# Patient Record
Sex: Male | Born: 1980 | Race: White | Hispanic: No | Marital: Single | Smoking: Former smoker
Health system: Southern US, Community
[De-identification: ages and names within clinical notes are randomized; demographics above are authoritative.]

## PROBLEM LIST (undated history)

## (undated) DIAGNOSIS — F22 Delusional disorders: Secondary | ICD-10-CM

## (undated) DIAGNOSIS — F32A Depression, unspecified: Secondary | ICD-10-CM

## (undated) DIAGNOSIS — T1491XA Suicide attempt, initial encounter: Secondary | ICD-10-CM

## (undated) DIAGNOSIS — F319 Bipolar disorder, unspecified: Secondary | ICD-10-CM

## (undated) DIAGNOSIS — F329 Major depressive disorder, single episode, unspecified: Secondary | ICD-10-CM

## (undated) HISTORY — PX: ABDOMINAL SURGERY: SHX537

---

## 2004-06-19 ENCOUNTER — Emergency Department: Payer: Self-pay | Admitting: Emergency Medicine

## 2007-01-30 ENCOUNTER — Emergency Department: Payer: Self-pay | Admitting: Emergency Medicine

## 2008-12-10 ENCOUNTER — Emergency Department: Payer: Self-pay | Admitting: Emergency Medicine

## 2010-05-13 ENCOUNTER — Inpatient Hospital Stay: Payer: Self-pay | Admitting: Psychiatry

## 2011-08-01 ENCOUNTER — Emergency Department: Payer: Self-pay | Admitting: Emergency Medicine

## 2012-02-19 ENCOUNTER — Emergency Department (HOSPITAL_COMMUNITY)
Admission: EM | Admit: 2012-02-19 | Discharge: 2012-02-19 | Disposition: A | Discharge status: Home / Self Care | Payer: Self-pay | Attending: Emergency Medicine | Admitting: Emergency Medicine

## 2012-02-19 ENCOUNTER — Encounter (HOSPITAL_COMMUNITY): Payer: Self-pay

## 2012-02-19 DIAGNOSIS — F17200 Nicotine dependence, unspecified, uncomplicated: Secondary | ICD-10-CM | POA: Insufficient documentation

## 2012-02-19 DIAGNOSIS — F319 Bipolar disorder, unspecified: Secondary | ICD-10-CM | POA: Insufficient documentation

## 2012-02-19 DIAGNOSIS — L03119 Cellulitis of unspecified part of limb: Secondary | ICD-10-CM | POA: Insufficient documentation

## 2012-02-19 DIAGNOSIS — L02419 Cutaneous abscess of limb, unspecified: Secondary | ICD-10-CM | POA: Insufficient documentation

## 2012-02-19 DIAGNOSIS — L0291 Cutaneous abscess, unspecified: Secondary | ICD-10-CM

## 2012-02-19 HISTORY — DX: Bipolar disorder, unspecified: F31.9

## 2012-02-19 HISTORY — DX: Suicide attempt, initial encounter: T14.91XA

## 2012-02-19 HISTORY — DX: Depression, unspecified: F32.A

## 2012-02-19 HISTORY — DX: Delusional disorders: F22

## 2012-02-19 HISTORY — DX: Major depressive disorder, single episode, unspecified: F32.9

## 2012-02-19 MED ORDER — DOXYCYCLINE HYCLATE 100 MG PO CAPS: 100.0000 mg | ORAL_CAPSULE | Freq: Two times a day (BID) | ORAL | Status: AC

## 2012-02-19 MED ORDER — DOXYCYCLINE HYCLATE 100 MG PO TABS
100.0000 mg | ORAL_TABLET | Freq: Once | ORAL | Status: AC
  Administered 2012-02-19: 100 mg via ORAL
  Filled 2012-02-19: qty 1

## 2012-02-19 NOTE — ED Provider Notes (Signed)
Medical screening examination/treatment/procedure(s) were performed by non-physician practitioner and as supervising physician I was immediately available for consultation/collaboration.   Lehman Whiteley W. Manami Tutor, MD 02/19/12 2108 

## 2012-02-19 NOTE — ED Notes (Signed)
Pt reports 2 spider bites 1 to left thigh and 1 to right thigh, did not see spider but areas have 2 puncture marks, areas are not draining, painful --no fever.

## 2012-02-19 NOTE — Discharge Instructions (Signed)
Abscess An abscess (boil or furuncle) is an infected area that contains a collection of pus.  SYMPTOMS Signs and symptoms of an abscess include pain, tenderness, redness, or hardness. You may feel a moveable soft area under your skin. An abscess can occur anywhere in the body.  TREATMENT  A surgical cut (incision) may be made over your abscess to drain the pus. Gauze may be packed into the space or a drain may be looped through the abscess cavity (pocket). This provides a drain that will allow the cavity to heal from the inside outwards. The abscess may be painful for a few days, but should feel much better if it was drained.  Your abscess, if seen early, may not have localized and may not have been drained. If not, another appointment may be required if it does not get better on its own or with medications. HOME CARE INSTRUCTIONS   Only take over-the-counter or prescription medicines for pain, discomfort, or fever as directed by your caregiver.   Take your antibiotics as directed if they were prescribed. Finish them even if you start to feel better.   Keep the skin and clothes clean around your abscess.   If the abscess was drained, you will need to use gauze dressing to collect any draining pus. Dressings will typically need to be changed 3 or more times a day.   The infection may spread by skin contact with others. Avoid skin contact as much as possible.   Practice good hygiene. This includes regular hand washing, cover any draining skin lesions, and do not share personal care items.   If you participate in sports, do not share athletic equipment, towels, whirlpools, or personal care items. Shower after every practice or tournament.   If a draining area cannot be adequately covered:   Do not participate in sports.   Children should not participate in day care until the wound has healed or drainage stops.   If your caregiver has given you a follow-up appointment, it is very important  to keep that appointment. Not keeping the appointment could result in a much worse infection, chronic or permanent injury, pain, and disability. If there is any problem keeping the appointment, you must call back to this facility for assistance.  SEEK MEDICAL CARE IF:   You develop increased pain, swelling, redness, drainage, or bleeding in the wound site.   You develop signs of generalized infection including muscle aches, chills, fever, or a general ill feeling.   You have an oral temperature above 102 F (38.9 C).  MAKE SURE YOU:   Understand these instructions.   Will watch your condition.   Will get help right away if you are not doing well or get worse.  Document Released: 05/27/2005 Document Revised: 08/06/2011 Document Reviewed: 03/20/2008 [facility] Patient Information 2012 [place], [place].Heat Therapy Your caregiver advises heat therapy for your condition. Heat applications help reduce pain and muscle spasm around injuries or areas of inflammation. They also increase blood flow to the area which can speed healing. Moist heat is commonly used to help heal skin infections. Heat treatments should be used for about 30-40 minutes every 2-4 hours. Shorter treatments should be used if there is discomfort. Different forms of heat therapy are:  Warm water - Use a basin or tub filled with heated water; change it often to keep the water hot. The water temperature should not be uncomfortable to the skin.   Hot packs - Use several bath towels soaked in hot  water and lightly wrung out. These should be changed every 5-10 minutes. You can buy commercially-available packs that provide more sustained heat. Hot water bottles are not recommended because they give only a small amount of heat.   Electric heating pads - These may be used for dry heat only. Do not use wet material around a regular heating pad because of the risk of electrical shock. Do not leave heating pads on for long periods as they can  burn the skin or cause permanent discoloration. Do not lie on top of a heating pad because, again, this can cause a burn.   Heat lamps - Use an infrared light. Keep the bulb 15-25 inches from the skin. Watch for signs of excessive heat (blotchy areas will appear).  Be cautious with heat therapy to avoid burning the skin. You should not use heat therapy without careful medical supervision if you have: circulation problems, numbness or unusual swelling in the area to be treated. Document Released: 08/17/2005 Document Revised: 08/06/2011 Document Reviewed: 02/12/2007 [facility] Patient Information 2012 [place], [place].  Take the antibiotic as directed.  Apply warm compresses several times daily.  Return if symptoms worsen.

## 2012-02-19 NOTE — ED Provider Notes (Signed)
History     CSN: [id number]  Arrival date & time 02/19/12  1029   First MD Initiated Contact with Patient 02/19/12 1043      Chief Complaint  Patient presents with  . Wound Check    (Consider location/radiation/quality/duration/timing/severity/associated sxs/prior treatment) HPI Comments: Not seen in ED before today for this problem.  Thinks a spider bit him.  Started 3 days ago.  No h/o MRSA  Patient is a 31 y.o. male presenting with wound check. The history is provided by the patient. No language interpreter was used.  Wound Check     Past Medical History  Diagnosis Date  . Depression   . Bipolar disorder   . Suicide attempt   . Paranoia     History reviewed. No pertinent past surgical history.  No family history on file.  History  Substance Use Topics  . Smoking status: Current Everyday Smoker    Types: Cigarettes  . Smokeless tobacco: Not on file  . Alcohol Use: No      Review of Systems  Constitutional: Negative for fever and chills.  Skin: Positive for wound.  All other systems reviewed and are negative.    Allergies  Review of patient's allergies indicates no known allergies.  Home Medications   Current Outpatient Rx  Name Route Sig Dispense Refill  . TEGRETOL PO Oral Take 1 tablet by mouth daily.    Marland Kitchen DOXYCYCLINE HYCLATE 100 MG PO CAPS Oral Take 1 capsule (100 mg total) by mouth 2 (two) times daily. 20 capsule 0    BP 133/79  Pulse 88  Temp 97.3 F (36.3 C) (Oral)  Resp 18  Ht 5\' 9"  (1.753 m)  Wt 155 lb (70.308 kg)  BMI 22.89 kg/m2  SpO2 100%  Physical Exam  Nursing note and vitals reviewed. Constitutional: He is oriented to person, place, and time. He appears well-developed and well-nourished.  HENT:  Head: Normocephalic and atraumatic.  Eyes: EOM are normal.  Neck: Normal range of motion.  Cardiovascular: Normal rate, regular rhythm, normal heart sounds and intact distal pulses.   Pulmonary/Chest: Effort normal and breath  sounds normal. No respiratory distress.  Abdominal: Soft. He exhibits no distension. There is no tenderness.  Musculoskeletal: Normal range of motion. He exhibits tenderness.       Legs: Neurological: He is alert and oriented to person, place, and time.  Skin: Skin is warm and dry.  Psychiatric: He has a normal mood and affect. Judgment normal.    ED Course  Procedures (including critical care time)  Labs Reviewed - No data to display No results found.   1. Abscess       MDM  rx doxycycline, 20 Heat Return prn.        Worthy Rancher, PA 02/19/12 [phone]

## 2015-11-22 ENCOUNTER — Emergency Department
Admission: EM | Admit: 2015-11-22 | Discharge: 2015-11-22 | Disposition: A | Discharge status: Home / Self Care | Payer: Self-pay | Attending: Emergency Medicine | Admitting: Emergency Medicine

## 2015-11-22 ENCOUNTER — Encounter: Payer: Self-pay | Admitting: *Deleted

## 2015-11-22 DIAGNOSIS — Z79899 Other long term (current) drug therapy: Secondary | ICD-10-CM | POA: Insufficient documentation

## 2015-11-22 DIAGNOSIS — Y9389 Activity, other specified: Secondary | ICD-10-CM | POA: Insufficient documentation

## 2015-11-22 DIAGNOSIS — F329 Major depressive disorder, single episode, unspecified: Secondary | ICD-10-CM | POA: Insufficient documentation

## 2015-11-22 DIAGNOSIS — Z87891 Personal history of nicotine dependence: Secondary | ICD-10-CM | POA: Insufficient documentation

## 2015-11-22 DIAGNOSIS — S61215A Laceration without foreign body of left ring finger without damage to nail, initial encounter: Secondary | ICD-10-CM | POA: Insufficient documentation

## 2015-11-22 DIAGNOSIS — Y999 Unspecified external cause status: Secondary | ICD-10-CM | POA: Insufficient documentation

## 2015-11-22 DIAGNOSIS — S61209A Unspecified open wound of unspecified finger without damage to nail, initial encounter: Secondary | ICD-10-CM

## 2015-11-22 DIAGNOSIS — F22 Delusional disorders: Secondary | ICD-10-CM | POA: Insufficient documentation

## 2015-11-22 DIAGNOSIS — W260XXA Contact with knife, initial encounter: Secondary | ICD-10-CM | POA: Insufficient documentation

## 2015-11-22 DIAGNOSIS — Y929 Unspecified place or not applicable: Secondary | ICD-10-CM | POA: Insufficient documentation

## 2015-11-22 NOTE — ED Notes (Signed)
Pt cut third finger of left hand while shopping up a salad this evening. "I was tryin to be healthy" pt states. Half of nail is missing from same finger.

## 2015-11-22 NOTE — ED Provider Notes (Signed)
[facility] Emergency Department Provider Note  ____________________________________________  Time seen: Approximately 7:56 PM  I have reviewed the triage vital signs and the nursing notes.   HISTORY  Chief Complaint Extremity Laceration    HPI Carl Holt is a 35 y.o. male who presents emergency department complaining of a laceration to the left third digit. Patient states that he was cutting a salad with a sharp knife when he accidentally sliced the distal end of his finger. Patient was able control the bleeding prior to arrival with direct pressure. Patient states that he is up-to-date on his immunizations. He denies any other complaints at this time.   Past Medical History  Diagnosis Date  . Depression   . Bipolar disorder (HCC)   . Suicide attempt (HCC)   . Paranoia (HCC)     There are no active problems to display for this patient.   Past Surgical History  Procedure Laterality Date  . Abdominal surgery      Current Outpatient Rx  Name  Route  Sig  Dispense  Refill  . CarBAMazepine (TEGRETOL PO)   Oral   Take 1 tablet by mouth daily.           Allergies Review of patient's allergies indicates no known allergies.  History reviewed. No pertinent family history.  Social History Social History  Substance Use Topics  . Smoking status: Former Smoker    Types: Cigarettes  . Smokeless tobacco: None  . Alcohol Use: No     Review of Systems  Musculoskeletal: Negative for hand pain. Skin: Negative for rash. Positive for laceration to the distal third digit right hand. Neurological: Negative for headaches, focal weakness or numbness. 10-point ROS otherwise negative.  ____________________________________________   PHYSICAL EXAM:  VITAL SIGNS: ED Triage Vitals  Enc Vitals Group     BP 11/22/15 1937 158/97 mmHg     Pulse Rate 11/22/15 1937 80     Resp 11/22/15 1937 16     Temp 11/22/15 1937 98 F (36.7 C)     Temp  Source 11/22/15 1937 Oral     SpO2 11/22/15 1937 97 %     Weight 11/22/15 1937 160 lb (72.576 kg)     Height 11/22/15 1937  (1.753 m)     Head Cir --      Peak Flow --      Pain Score 11/22/15 1936 4     Pain Loc --      Pain Edu? --      Excl. in GC? --      Constitutional: Alert and oriented. Well appearing and in no acute distress.. Cardiovascular: Normal rate, regular rhythm. Normal S1 and S2.  Good peripheral circulation. Respiratory: Normal respiratory effort without tachypnea or retractions. Lungs CTAB. Musculoskeletal: Full range of motion to all digits of the left hand. Sensation intact 5 digits. Cap refill intact 5 digits. Neurologic:  Normal speech and language. No gross focal neurologic deficits are appreciated.  Skin:  Skin is warm, dry and intact. No rash noted. Avulsion noted to the distal aspect of the third digit left hand. Area is bleeding after pressure is removed. No foreign body. Psychiatric: Mood and affect are normal. Speech and behavior are normal. Patient exhibits appropriate insight and judgement.   ____________________________________________   LABS (all labs ordered are listed, but only abnormal results are displayed)  Labs Reviewed - No data to display ____________________________________________  EKG   ____________________________________________  RADIOLOGY   No results  found.  ____________________________________________    PROCEDURES  Procedure(s) performed:    LACERATION REPAIR Performed by: Racheal PatchesJonathan D Cuthriell Authorized by: Delorise RoyalsJonathan D Cuthriell Consent: Verbal consent obtained. Risks and benefits: risks, benefits and alternatives were discussed Consent given by: patient Patient identity confirmed: provided demographic data Prepped and Draped in normal sterile fashion Wound explored  Laceration Location: Third digit left hand  No Foreign Bodies seen or palpated  Irrigation method: syringe Amount of cleaning:  standard  Skin closure: The area is an avulsion with no closable edges.   Technique: Area is packed using Surgicel with good cessation of bleeding. This is covered using a nonadhesive dressing and is wrapped firmly.   Patient tolerance: Patient tolerated the procedure well with no immediate complications.    Medications - No data to display   ____________________________________________   INITIAL IMPRESSION / ASSESSMENT AND PLAN / ED COURSE  Pertinent labs & imaging results that were available during my care of the patient were reviewed by me and considered in my medical decision making (see chart for details).  Patient's diagnosis is consistent with avulsion to the distal aspect of the third digit left hand. This is treated as described above. No complications. .Patient is to follow up with primary care provider if symptoms persist past this treatment course. Patient is given ED precautions to return to the ED for any worsening or new symptoms.     ____________________________________________  FINAL CLINICAL IMPRESSION(S) / ED DIAGNOSES  Final diagnoses:  Finger avulsion, initial encounter      NEW MEDICATIONS STARTED DURING THIS VISIT:  New Prescriptions   No medications on file        This chart was dictated using voice recognition software/Dragon. Despite best efforts to proofread, errors can occur which can change the meaning. Any change was purely unintentional.    Racheal PatchesJonathan D Cuthriell, PA-C 11/22/15 2001  Arnaldo NatalPaul F Malinda, MD 11/22/15 2116

## 2015-11-22 NOTE — ED Notes (Signed)
Pt presents w/ L 3rd digit laceration sustained while cutting lettuce w/ knife. Bleeding controlled w/ dressing applied in triage.

## 2015-11-22 NOTE — Discharge Instructions (Signed)
Deep Skin Avulsion A deep skin avulsion is a type of open wound. It often results from a severe injury (trauma) that tears away all layers of the skin or an entire body part. The areas of the body that are most often affected by a deep skin avulsion include the face, lips, ears, nose, and fingers. A deep skin avulsion may make structures below the skin become visible. You may be able to see muscle, bone, nerves, and blood vessels. A deep skin avulsion can also damage important structures beneath the skin. These include tendons, ligaments, nerves, or blood vessels. CAUSES Injuries that often cause a deep skin avulsion include:  Being crushed.  Falling against a jagged surface.  Animal bites.  Gunshot wounds.  Severe burns.  Injuries that involve being dragged, such as bicycle or motorcycle accidents. SYMPTOMS Symptoms of a deep skin avulsion include:  Pain.  Numbness.  Swelling.  A misshapen body part.  Bleeding, which may be heavy.  Fluid leaking from the wound. DIAGNOSIS This condition may be diagnosed with a medical history and physical exam. You may also have X-rays done. TREATMENT The treatment that is chosen for a deep skin avulsion depends on how large and deep the wound is and where it is located. Treatment for all types of avulsions usually starts with:  Controlling the bleeding.  Washing out the wound with a germ-free (sterile) salt-water solution.  Removing dead tissue from the wound. A wound may be closed or left open to heal. This depends on the size and location of the wound and whether it is likely to become infected. Wounds are usually covered or closed if they expose blood vessels, nerves, bone, or cartilage.  Wounds that are small and clean may be closed with stitches (sutures).  Wounds that cannot be closed with sutures may be covered with a piece of skin (graft) or a skin flap. Skin may be taken from on or near the wound, from another part of the body,  or from a donor.  Wounds may be left open if they are hard to close or they may become infected. These wounds heal over time from the bottom up. You may also receive medicine. This may include:  Antibiotics.  A tetanus shot.  Rabies vaccine. HOME CARE INSTRUCTIONS Medicines  Take or apply over-the-counter and prescription medicines only as told by your health care provider.  If you were prescribed an antibiotic, take or apply it as told by your health care provider. Do not stop taking the antibiotic even if your condition improves.  You may get anti-itch medicine while your wound is healing. Use it only as told by your health care provider. Wound Care  There are many ways to close and cover a wound. For example, a wound can be covered with sutures, skin glue, or adhesive strips. Follow instructions from your health care provider about:  How to take care of your wound.  When and how you should change your bandage (dressing).  When you should remove your dressing.  Removing whatever was used to close your wound.  Keep the dressing dry as told by your health care provider. Do not take baths, swim, use a hot tub, or do anything that would put your wound underwater until your health care provider approves.  Clean the wound each day or as told by your health care provider.  Wash the wound with mild soap and water.  Rinse the wound with water to remove all soap.  Pat the wound   dry with a clean towel. Do not rub it.  Do not scratch or pick at the wound.  Check your wound every day for signs of infection. Watch for:  Redness, swelling, or pain.  Fluid, blood, or pus. General Instructions  Raise (elevate) the injured area above the level of your heart while you are sitting or lying down.  Keep all follow-up visits as told by your health care provider. This is important. SEEK MEDICAL CARE IF:  You received a tetanus shot and you have swelling, severe pain, redness, or  bleeding at the injection site.  You have a fever.  Your pain is not controlled with medicine.  You have increased redness, swelling, or pain at the site of your wound.  You have fluid, blood, or pus coming from your wound.  You notice a bad smell coming from your wound or your dressing.  A wound that was closed breaks open.  You notice something coming out of the wound, such as wood or glass.  You notice a change in the color of your skin near your wound.  You develop a new rash.  You need to change the dressing frequently due to fluid, blood, or pus draining from the wound. SEEK IMMEDIATE MEDICAL CARE IF:  Your pain suddenly increases and is severe.  You develop severe swelling around the wound.  You develop numbness around the wound.  You have nausea and vomiting that does not go away after 24 hours.  You feel light-headed, weak, or faint.  You develop chest pain.  You have trouble breathing.  Your wound is on your hand or foot and you cannot properly move a finger or toe.  The wound is on your hand or foot and you notice that your fingers or toes look pale or bluish.  You have a red streak going away from your wound.   This information is not intended to replace advice given to you by your health care provider. Make sure you discuss any questions you have with your health care provider.   Document Released: 10/13/2006 Document Revised: 01/01/2015 Document Reviewed: 08/22/2014 [employer] Interactive Patient Education 2016 [employer]  

## 2016-01-11 ENCOUNTER — Emergency Department
Admission: EM | Admit: 2016-01-11 | Discharge: 2016-01-11 | Disposition: A | Discharge status: Home / Self Care | Payer: Self-pay

## 2016-05-31 ENCOUNTER — Encounter: Payer: Self-pay | Admitting: Emergency Medicine

## 2016-05-31 ENCOUNTER — Emergency Department
Admission: EM | Admit: 2016-05-31 | Discharge: 2016-05-31 | Disposition: A | Discharge status: Home / Self Care | Payer: Self-pay | Attending: Emergency Medicine | Admitting: Emergency Medicine

## 2016-05-31 DIAGNOSIS — T7840XA Allergy, unspecified, initial encounter: Secondary | ICD-10-CM | POA: Insufficient documentation

## 2016-05-31 DIAGNOSIS — Z87891 Personal history of nicotine dependence: Secondary | ICD-10-CM | POA: Insufficient documentation

## 2016-05-31 MED ORDER — DIPHENHYDRAMINE HCL 25 MG PO CAPS: 25.0000 mg | ORAL_CAPSULE | ORAL | 2 refills | Status: AC | PRN

## 2016-05-31 MED ORDER — METHYLPREDNISOLONE SODIUM SUCC 125 MG IJ SOLR
125.0000 mg | Freq: Once | INTRAMUSCULAR | Status: AC
  Administered 2016-05-31: 125 mg via INTRAVENOUS
  Filled 2016-05-31: qty 2

## 2016-05-31 MED ORDER — DIPHENHYDRAMINE HCL 50 MG/ML IJ SOLN
25.0000 mg | Freq: Once | INTRAMUSCULAR | Status: AC
  Administered 2016-05-31: 25 mg via INTRAVENOUS
  Filled 2016-05-31: qty 1

## 2016-05-31 MED ORDER — FAMOTIDINE IN NACL 20-0.9 MG/50ML-% IV SOLN
20.0000 mg | Freq: Once | INTRAVENOUS | Status: AC
  Administered 2016-05-31: 20 mg via INTRAVENOUS
  Filled 2016-05-31: qty 50

## 2016-05-31 MED ORDER — PREDNISONE 50 MG PO TABS: 50.0000 mg | ORAL_TABLET | Freq: Every day | ORAL | 0 refills | Status: AC

## 2016-05-31 NOTE — ED Triage Notes (Signed)
Pt got stung by bee 4 hrs ago and then 30 min ago eye started swelling.does not feel like throat swelling. No respiratory distress. Handling secretions

## 2016-05-31 NOTE — ED Provider Notes (Signed)
[facility] Emergency Department Provider Note   ____________________________________________    I have reviewed the triage vital signs and the nursing notes.   HISTORY  Chief Complaint Allergic Reaction     HPI Carl Holt is a 35 y.o. male who presents with complaints of possible allergic reaction. Patient reports that he thinks he was stung by a bee on his foot, later that day he noticed that his foot had developed a red rash in his right eye had swollen. He describes a tickle in his throat. No difficulty breathing, no intraoral swelling. No history of anaphylaxis.   Past Medical History:  Diagnosis Date  . Bipolar disorder (HCC)   . Depression   . Paranoia (HCC)   . Suicide attempt     There are no active problems to display for this patient.   Past Surgical History:  Procedure Laterality Date  . ABDOMINAL SURGERY      Prior to Admission medications   Medication Sig Start Date End Date Taking? Authorizing Provider  CarBAMazepine (TEGRETOL PO) Take 1 tablet by mouth daily.    Historical Provider, MD  diphenhydrAMINE (BENADRYL) 25 mg capsule Take 1 capsule (25 mg total) by mouth every 4 (four) hours as needed. 05/31/16 05/31/17  Jene Every, MD  predniSONE (DELTASONE) 50 MG tablet Take 1 tablet (50 mg total) by mouth daily with breakfast. 05/31/16   Jene Every, MD     Allergies Review of patient's allergies indicates no known allergies.  History reviewed. No pertinent family history.  Social History Social History  Substance Use Topics  . Smoking status: Former Smoker    Types: Cigarettes  . Smokeless tobacco: Not on file  . Alcohol use No    Review of Systems  Constitutional: No fever/chills Eyes: No visual changes.  ENT: No Throat swelling Cardiovascular: Denies chest pain. Respiratory: Denies shortness of breath. Gastrointestinal: No abdominal pain.  No nausea, no vomiting.     Skin: As above   10-point  ROS otherwise negative.  ____________________________________________   PHYSICAL EXAM:  VITAL SIGNS: ED Triage Vitals  Enc Vitals Group     BP 05/31/16 2038 (!) 143/92     Pulse Rate 05/31/16 2038 76     Resp 05/31/16 2038 18     Temp 05/31/16 2038 98.2 F (36.8 C)     Temp Source 05/31/16 2038 Oral     SpO2 05/31/16 2038 99 %     Weight 05/31/16 1724 160 lb (72.6 kg)     Height 05/31/16 1724 5\' 9"  (1.753 m)     Head Circumference --      Peak Flow --      Pain Score 05/31/16 1724 2     Pain Loc --      Pain Edu? --      Excl. in GC? --     Constitutional: Alert and oriented. No acute distress. Pleasant and interactive Eyes: Conjunctivae are normal. Mild swelling under right eye Head: Atraumatic. Nose: No congestion/rhinnorhea. Mouth/Throat: Mucous membranes are moist.  Pharynx is normal  Cardiovascular: Normal rate, regular rhythm. Grossly normal heart sounds.  Good peripheral circulation. Respiratory: Normal respiratory effort.  No retractions. Lungs CTAB. Gastrointestinal: Soft and nontender. No distention.  No CVA tenderness. Genitourinary: deferred Musculoskeletal: No lower extremity tenderness nor edema.  Warm and well perfused Neurologic:  Normal speech and language. No gross focal neurologic deficits are appreciated.  Skin:  Skin is warm, dry. Urticarial-like erythematous rash on the right  foot with some mild swelling, no tenderness to palpation Psychiatric: Mood and affect are normal. Speech and behavior are normal.  ____________________________________________   LABS (all labs ordered are listed, but only abnormal results are displayed)  Labs Reviewed - No data to display ____________________________________________  EKG  None ____________________________________________  RADIOLOGY  None ____________________________________________   PROCEDURES  Procedure(s) performed: No    Critical Care performed:  No ____________________________________________   INITIAL IMPRESSION / ASSESSMENT AND PLAN / ED COURSE  Pertinent labs & imaging results that were available during my care of the patient were reviewed by me and considered in my medical decision making (see chart for details).  Patient presents with rash, erythema and facial swelling after likely bee sting. No difficulty breathing. We will give IV steroids, IV Pepcid, IV Benadryl and observe the patient  Clinical Course  ----------------------------------------- 8:50 PM on 05/31/2016 -----------------------------------------  Patient is anxious to leave, no worsening of his symptoms. I will discharge him with prednisone and Benadryl and strict return precautions. ____________________________________________   FINAL CLINICAL IMPRESSION(S) / ED DIAGNOSES  Final diagnoses:  Allergic reaction, initial encounter      NEW MEDICATIONS STARTED DURING THIS VISIT:  Discharge Medication List as of 05/31/2016  8:36 PM    START taking these medications   Details  diphenhydrAMINE (BENADRYL) 25 mg capsule Take 1 capsule (25 mg total) by mouth every 4 (four) hours as needed., Starting Sun 05/31/2016, Until Mon 05/31/2017, Print    predniSONE (DELTASONE) 50 MG tablet Take 1 tablet (50 mg total) by mouth daily with breakfast., Starting Sun 05/31/2016, Print         Note:  This document was prepared using Dragon voice recognition software and may include unintentional dictation errors.    Jene Everyobert Vashon Riordan, MD 05/31/16 2050

## 2016-11-05 ENCOUNTER — Emergency Department
Admission: EM | Admit: 2016-11-05 | Discharge: 2016-11-05 | Disposition: A | Discharge status: Home / Self Care | Payer: Self-pay | Attending: Emergency Medicine | Admitting: Emergency Medicine

## 2016-11-05 ENCOUNTER — Emergency Department: Payer: Self-pay

## 2016-11-05 ENCOUNTER — Encounter: Payer: Self-pay | Admitting: Emergency Medicine

## 2016-11-05 DIAGNOSIS — S62339A Displaced fracture of neck of unspecified metacarpal bone, initial encounter for closed fracture: Secondary | ICD-10-CM

## 2016-11-05 DIAGNOSIS — F1290 Cannabis use, unspecified, uncomplicated: Secondary | ICD-10-CM | POA: Insufficient documentation

## 2016-11-05 DIAGNOSIS — Z87891 Personal history of nicotine dependence: Secondary | ICD-10-CM | POA: Insufficient documentation

## 2016-11-05 DIAGNOSIS — Y929 Unspecified place or not applicable: Secondary | ICD-10-CM | POA: Insufficient documentation

## 2016-11-05 DIAGNOSIS — Y9389 Activity, other specified: Secondary | ICD-10-CM | POA: Insufficient documentation

## 2016-11-05 DIAGNOSIS — W228XXA Striking against or struck by other objects, initial encounter: Secondary | ICD-10-CM | POA: Insufficient documentation

## 2016-11-05 DIAGNOSIS — Y999 Unspecified external cause status: Secondary | ICD-10-CM | POA: Insufficient documentation

## 2016-11-05 DIAGNOSIS — S62396A Other fracture of fifth metacarpal bone, right hand, initial encounter for closed fracture: Secondary | ICD-10-CM | POA: Insufficient documentation

## 2016-11-05 MED ORDER — TRAMADOL HCL 50 MG PO TABS: 50.0000 mg | ORAL_TABLET | Freq: Four times a day (QID) | ORAL | 0 refills | Status: AC | PRN

## 2016-11-05 MED ORDER — NAPROXEN 500 MG PO TABS: 500.0000 mg | ORAL_TABLET | Freq: Two times a day (BID) | ORAL | Status: AC

## 2016-11-05 NOTE — ED Provider Notes (Signed)
[facility] Emergency Department Provider Note   ____________________________________________   First MD Initiated Contact with Patient 11/05/16 1347     (approximate)  I have reviewed the triage vital signs and the nursing notes.   HISTORY  Chief Complaint Hand Pain    HPI Carl Holt is a 36 y.o. male patient complaining of right hand pain secondary to punching a counter prior to arrival. Patient rates his pain as a 2/10. Patient described a pain as "achy". Patient denies loss sensation or loss of function of the right hand. Patient is right-hand dominant.   Past Medical History:  Diagnosis Date  . Bipolar disorder (HCC)   . Depression   . Paranoia (HCC)   . Suicide attempt     There are no active problems to display for this patient.   Past Surgical History:  Procedure Laterality Date  . ABDOMINAL SURGERY      Prior to Admission medications   Medication Sig Start Date End Date Taking? Authorizing Provider  CarBAMazepine (TEGRETOL PO) Take 1 tablet by mouth daily.    Historical Provider, MD  diphenhydrAMINE (BENADRYL) 25 mg capsule Take 1 capsule (25 mg total) by mouth every 4 (four) hours as needed. 05/31/16 05/31/17  Jene Everyobert Kinner, MD  naproxen (NAPROSYN) 500 MG tablet Take 1 tablet (500 mg total) by mouth 2 (two) times daily with a meal. 11/05/16   Joni Reiningonald K Jaystin Mcgarvey, PA-C  predniSONE (DELTASONE) 50 MG tablet Take 1 tablet (50 mg total) by mouth daily with breakfast. 05/31/16   Jene Everyobert Kinner, MD  traMADol (ULTRAM) 50 MG tablet Take 1 tablet (50 mg total) by mouth every 6 (six) hours as needed. 11/05/16 11/05/17  Joni Reiningonald K Francisco Eyerly, PA-C    Allergies Patient has no known allergies.  History reviewed. No pertinent family history.  Social History Social History  Substance Use Topics  . Smoking status: Former Smoker    Types: Cigarettes  . Smokeless tobacco: Never Used  . Alcohol use No    Review of Systems Constitutional: No  fever/chills Eyes: No visual changes. ENT: No sore throat. Cardiovascular: Denies chest pain. Respiratory: Denies shortness of breath. Gastrointestinal: No abdominal pain.  No nausea, no vomiting.  No diarrhea.  No constipation. Genitourinary: Negative for dysuria. Musculoskeletal: Negative for back pain. Skin: Negative for rash. Neurological: Negative for headaches, focal weakness or numbness. Psychiatric:Depression, paranoia, bipolar, and suicide attempt.   ____________________________________________   PHYSICAL EXAM:  VITAL SIGNS: ED Triage Vitals  Enc Vitals Group     BP 11/05/16 1317 (!) 150/110     Pulse Rate 11/05/16 1317 85     Resp 11/05/16 1317 16     Temp 11/05/16 1317 98.3 F (36.8 C)     Temp Source 11/05/16 1317 Oral     SpO2 11/05/16 1317 98 %     Weight 11/05/16 1318 160 lb (72.6 kg)     Height 11/05/16 1318 5\' 9"  (1.753 m)     Head Circumference --      Peak Flow --      Pain Score 11/05/16 1324 2     Pain Loc --      Pain Edu? --      Excl. in GC? --     Constitutional: Alert and oriented. Well appearing and in no acute distress. Eyes: Conjunctivae are normal. PERRL. EOMI. Head: Atraumatic. Nose: No congestion/rhinnorhea. Mouth/Throat: Mucous membranes are moist.  Oropharynx non-erythematous. Neck: No stridor.  No cervical spine tenderness to palpation. Hematological/Lymphatic/Immunilogical:  No cervical lymphadenopathy. Cardiovascular: Normal rate, regular rhythm. Grossly normal heart sounds.  Good peripheral circulation. Respiratory: Normal respiratory effort.  No retractions. Lungs CTAB. Gastrointestinal: Soft and nontender. No distention. No abdominal bruits. No CVA tenderness. Musculoskeletal: No lower extremity tenderness nor edema.  No joint effusions. Neurologic:  Normal speech and language. No gross focal neurologic deficits are appreciated. No gait instability. Skin:  Skin is warm, dry and intact. No rash noted. Psychiatric: Mood and affect  are normal. Speech and behavior are normal.  ____________________________________________   LABS (all labs ordered are listed, but only abnormal results are displayed)  Labs Reviewed - No data to display ____________________________________________  EKG   ____________________________________________  RADIOLOGY  X-rays reveal nondisplaced fractures of the fifth metatarsal head right hand. ____________________________________________   PROCEDURES  Procedure(s) performed: None  Procedures  Critical Care performed: No  ____________________________________________   INITIAL IMPRESSION / ASSESSMENT AND PLAN / ED COURSE  Pertinent labs & imaging results that were available during my care of the patient were reviewed by me and considered in my medical decision making (see chart for details).  Boxer's fracture right hand. Discussed x-ray finding with patient. Patient placed in ulnar splint. Patient advised follow orthopedics for reevaluation in 3-5 days.is given prescription for naproxen and tramadol.       ____________________________________________   FINAL CLINICAL IMPRESSION(S) / ED DIAGNOSES  Final diagnoses:  Boxer's fracture, closed, initial encounter      NEW MEDICATIONS STARTED DURING THIS VISIT:  New Prescriptions   NAPROXEN (NAPROSYN) 500 MG TABLET    Take 1 tablet (500 mg total) by mouth 2 (two) times daily with a meal.   TRAMADOL (ULTRAM) 50 MG TABLET    Take 1 tablet (50 mg total) by mouth every 6 (six) hours as needed.     Note:  This document was prepared using Dragon voice recognition software and may include unintentional dictation errors.    Joni Reining, PA-C 11/05/16 1431    Governor Rooks, MD 11/05/16 [phone]

## 2016-11-05 NOTE — ED Triage Notes (Signed)
States punched a counter   Having pain to right hand

## 2017-12-20 ENCOUNTER — Emergency Department: Payer: Self-pay

## 2017-12-20 ENCOUNTER — Encounter: Payer: Self-pay | Admitting: Emergency Medicine

## 2017-12-20 ENCOUNTER — Other Ambulatory Visit: Payer: Self-pay

## 2017-12-20 ENCOUNTER — Emergency Department
Admission: EM | Admit: 2017-12-20 | Discharge: 2017-12-20 | Disposition: A | Discharge status: Home / Self Care | Payer: Self-pay | Attending: Emergency Medicine | Admitting: Emergency Medicine

## 2017-12-20 DIAGNOSIS — Z87891 Personal history of nicotine dependence: Secondary | ICD-10-CM | POA: Insufficient documentation

## 2017-12-20 DIAGNOSIS — N200 Calculus of kidney: Secondary | ICD-10-CM | POA: Insufficient documentation

## 2017-12-20 DIAGNOSIS — F319 Bipolar disorder, unspecified: Secondary | ICD-10-CM | POA: Insufficient documentation

## 2017-12-20 DIAGNOSIS — Z79899 Other long term (current) drug therapy: Secondary | ICD-10-CM | POA: Insufficient documentation

## 2017-12-20 LAB — URINALYSIS, COMPLETE (UACMP) WITH MICROSCOPIC
Bilirubin Urine: NEGATIVE
GLUCOSE, UA: 50 mg/dL — AB
KETONES UR: 20 mg/dL — AB
LEUKOCYTES UA: NEGATIVE
Nitrite: NEGATIVE
PROTEIN: 30 mg/dL — AB
Specific Gravity, Urine: 1.018 (ref 1.005–1.030)
pH: 6 (ref 5.0–8.0)

## 2017-12-20 LAB — COMPREHENSIVE METABOLIC PANEL
ALT: 24 U/L (ref 17–63)
ANION GAP: 12 (ref 5–15)
AST: 27 U/L (ref 15–41)
Albumin: 4.9 g/dL (ref 3.5–5.0)
Alkaline Phosphatase: 67 U/L (ref 38–126)
BUN: 14 mg/dL (ref 6–20)
CO2: 19 mmol/L — ABNORMAL LOW (ref 22–32)
Calcium: 9 mg/dL (ref 8.9–10.3)
Chloride: 105 mmol/L (ref 101–111)
Creatinine, Ser: 0.97 mg/dL (ref 0.61–1.24)
Glucose, Bld: 167 mg/dL — ABNORMAL HIGH (ref 65–99)
POTASSIUM: 3.5 mmol/L (ref 3.5–5.1)
Sodium: 136 mmol/L (ref 135–145)
Total Bilirubin: 0.8 mg/dL (ref 0.3–1.2)
Total Protein: 7.2 g/dL (ref 6.5–8.1)

## 2017-12-20 LAB — URINE DRUG SCREEN, QUALITATIVE (ARMC ONLY)
AMPHETAMINES, UR SCREEN: NOT DETECTED
BENZODIAZEPINE, UR SCRN: NOT DETECTED
Barbiturates, Ur Screen: NOT DETECTED
CANNABINOID 50 NG, UR ~~LOC~~: POSITIVE — AB
Cocaine Metabolite,Ur ~~LOC~~: NOT DETECTED
MDMA (Ecstasy)Ur Screen: NOT DETECTED
Methadone Scn, Ur: NOT DETECTED
Opiate, Ur Screen: NOT DETECTED
Phencyclidine (PCP) Ur S: NOT DETECTED
Tricyclic, Ur Screen: NOT DETECTED

## 2017-12-20 LAB — CBC
HEMATOCRIT: 44.8 % (ref 40.0–52.0)
HEMOGLOBIN: 15.7 g/dL (ref 13.0–18.0)
MCH: 30.6 pg (ref 26.0–34.0)
MCHC: 35.1 g/dL (ref 32.0–36.0)
MCV: 87.2 fL (ref 80.0–100.0)
PLATELETS: 285 10*3/uL (ref 150–440)
RBC: 5.14 MIL/uL (ref 4.40–5.90)
RDW: 12.6 % (ref 11.5–14.5)
WBC: 14.1 10*3/uL — AB (ref 3.8–10.6)

## 2017-12-20 LAB — ETHANOL

## 2017-12-20 LAB — LIPASE, BLOOD: Lipase: 28 U/L (ref 11–51)

## 2017-12-20 MED ORDER — ONDANSETRON HCL 4 MG PO TABS: 4.0000 mg | ORAL_TABLET | Freq: Three times a day (TID) | ORAL | 0 refills | Status: AC | PRN

## 2017-12-20 MED ORDER — ONDANSETRON HCL 4 MG/2ML IJ SOLN
4.0000 mg | Freq: Once | INTRAMUSCULAR | Status: AC
  Administered 2017-12-20: 4 mg via INTRAVENOUS
  Filled 2017-12-20: qty 2

## 2017-12-20 MED ORDER — FENTANYL CITRATE (PF) 100 MCG/2ML IJ SOLN
50.0000 ug | INTRAMUSCULAR | Status: DC | PRN
  Administered 2017-12-20: 50 ug via INTRAVENOUS
  Filled 2017-12-20: qty 2

## 2017-12-20 MED ORDER — OXYCODONE-ACETAMINOPHEN 5-325 MG PO TABS: 1.0000 | ORAL_TABLET | ORAL | 0 refills | Status: AC | PRN

## 2017-12-20 MED ORDER — TAMSULOSIN HCL 0.4 MG PO CAPS: 0.4000 mg | ORAL_CAPSULE | Freq: Every day | ORAL | 0 refills | Status: AC

## 2017-12-20 MED ORDER — SODIUM CHLORIDE 0.9 % IV BOLUS
1000.0000 mL | Freq: Once | INTRAVENOUS | Status: AC
  Administered 2017-12-20: 1000 mL via INTRAVENOUS

## 2017-12-20 MED ORDER — IBUPROFEN 200 MG PO TABS: 600.0000 mg | ORAL_TABLET | Freq: Four times a day (QID) | ORAL | 0 refills | Status: AC | PRN

## 2017-12-20 MED ORDER — KETOROLAC TROMETHAMINE 30 MG/ML IJ SOLN
15.0000 mg | Freq: Once | INTRAMUSCULAR | Status: AC
  Administered 2017-12-20: 15 mg via INTRAVENOUS
  Filled 2017-12-20: qty 1

## 2017-12-20 NOTE — ED Notes (Signed)
Pt c/o pain in left side that started at 8am - c/o nausea and vomiting (vomited x4 in 24 hours in small amounts) - denies any other complaints

## 2017-12-20 NOTE — ED Provider Notes (Signed)
[facility] Emergency Department Provider Note  ____________________________________________   I have reviewed the triage vital signs and the nursing notes. Where available I have reviewed prior notes and, if possible and indicated, outside hospital notes.    HISTORY  Chief Complaint Abdominal Pain and Emesis    HPI Carl Holt is a 37 y.o. male  he does not have a history of kidney stones presents today complaining of an onset left flank pain radiating to the groin no fever no chills positive nausea mild vomiting, started this morning pain is sharp, nothing makes better nothing makes it worse, has not had this before, no other alleviating or aggravating symptoms no other associated symptoms.  No dysuria no hematuria.   Past Medical History:  Diagnosis Date  . Bipolar disorder (HCC)   . Depression   . Paranoia (HCC)   . Suicide attempt (HCC)     There are no active problems to display for this patient.   Past Surgical History:  Procedure Laterality Date  . ABDOMINAL SURGERY      Prior to Admission medications   Medication Sig Start Date End Date Taking? Authorizing Provider  CarBAMazepine (TEGRETOL PO) Take 1 tablet by mouth daily.    [provider]  diphenhydrAMINE (BENADRYL) 25 mg capsule Take 1 capsule (25 mg total) by mouth every 4 (four) hours as needed. 05/31/16 05/31/17  Jene Every, MD  ibuprofen (MOTRIN IB) 200 MG tablet Take 3 tablets (600 mg total) by mouth every 6 (six) hours as needed. 12/20/17   Jeanmarie Plant, MD  naproxen (NAPROSYN) 500 MG tablet Take 1 tablet (500 mg total) by mouth 2 (two) times daily with a meal. 11/05/16   Joni Reining, PA-C  ondansetron (ZOFRAN) 4 MG tablet Take 1 tablet (4 mg total) by mouth every 8 (eight) hours as needed for nausea or vomiting. 12/20/17   Jeanmarie Plant, MD  oxyCODONE-acetaminophen (PERCOCET) 5-325 MG tablet Take 1 tablet by mouth every 4 (four) hours as needed for severe  pain. 12/20/17   Jeanmarie Plant, MD  predniSONE (DELTASONE) 50 MG tablet Take 1 tablet (50 mg total) by mouth daily with breakfast. 05/31/16   Jene Every, MD  tamsulosin (FLOMAX) 0.4 MG CAPS capsule Take 1 capsule (0.4 mg total) by mouth daily. 12/20/17   Jeanmarie Plant, MD    Allergies Bee venom  No family history on file.  Social History Social History   Tobacco Use  . Smoking status: Former Smoker    Types: Cigarettes  . Smokeless tobacco: Never Used  Substance Use Topics  . Alcohol use: No  . 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 ENT: No sore throat. No stiff neck no neck pain Cardiovascular: Denies chest pain. Respiratory: Denies shortness of breath. Gastrointestinal:   no vomiting.  No diarrhea.  No constipation. Genitourinary: Negative for dysuria. Musculoskeletal: Negative lower extremity swelling Skin: Negative for rash. Neurological: Negative for severe headaches, focal weakness or numbness.   ____________________________________________   PHYSICAL EXAM:  VITAL SIGNS: ED Triage Vitals  Enc Vitals Group     BP 12/20/17 1252 (!) 161/108     Pulse Rate 12/20/17 1252 61     Resp 12/20/17 1252 (!) 22     Temp --      Temp src --      SpO2 12/20/17  1252 98 %     Weight 12/20/17 1248 160 lb (72.6 kg)     Height 12/20/17 1248 5\' 9"  (1.753 m)     Head Circumference --      Peak Flow --      Pain Score 12/20/17 1248 10     Pain Loc --      Pain Edu? --      Excl. in GC? --     Constitutional: Alert and oriented. Well appearing and in no acute distress. Eyes: Conjunctivae are normal Head: Atraumatic HEENT: No congestion/rhinnorhea. Mucous membranes are moist.  Oropharynx non-erythematous Neck:   Nontender with no meningismus, no masses, no stridor Cardiovascular: Normal rate, regular rhythm. Grossly normal heart sounds.  Good  peripheral circulation. Respiratory: Normal respiratory effort.  No retractions. Lungs CTAB. Abdominal: Soft and nontender. No distention. No guarding no rebound Back:  There is no focal tenderness or step off.  there is no midline tenderness there are no lesions noted. there is left CVA tenderness Musculoskeletal: No lower extremity tenderness, no upper extremity tenderness. No joint effusions, no DVT signs strong distal pulses no edema Neurologic:  Normal speech and language. No gross focal neurologic deficits are appreciated.  Skin:  Skin is warm, dry and intact. No rash noted. Psychiatric: Mood and affect are normal. Speech and behavior are normal.  ____________________________________________   LABS (all labs ordered are listed, but only abnormal results are displayed)  Labs Reviewed  COMPREHENSIVE METABOLIC PANEL - Abnormal; Notable for the following components:      Result Value   CO2 19 (*)    Glucose, Bld 167 (*)    All other components within normal limits  CBC - Abnormal; Notable for the following components:   WBC 14.1 (*)    All other components within normal limits  URINALYSIS, COMPLETE (UACMP) WITH MICROSCOPIC - Abnormal; Notable for the following components:   Color, Urine YELLOW (*)    APPearance HAZY (*)    Glucose, UA 50 (*)    Hgb urine dipstick LARGE (*)    Ketones, ur 20 (*)    Protein, ur 30 (*)    Squamous Epithelial / LPF 0-5 (*)    All other components within normal limits  URINE DRUG SCREEN, QUALITATIVE ([facility] ONLY) - Abnormal; Notable for the following components:   Cannabinoid 50 Ng, Ur [place] POSITIVE (*)    All other components within normal limits  LIPASE, BLOOD  ETHANOL    Pertinent labs  results that were available during my care of the patient were reviewed by me and considered in my medical decision making (see chart for details). ____________________________________________  EKG  I personally interpreted any EKGs ordered by me or  triage  ____________________________________________  RADIOLOGY  Pertinent labs & imaging results that were available during my care of the patient were reviewed by me and considered in my medical decision making (see chart for details). If possible, patient and/or family made aware of any abnormal findings.  Ct Renal Stone Study  Result Date: 12/20/2017 CLINICAL DATA:  37 year old male left-sided flank pain starting this morning. Initial encounter. EXAM: CT ABDOMEN AND PELVIS WITHOUT CONTRAST TECHNIQUE: Multidetector CT imaging of the abdomen and pelvis was performed following the standard protocol without IV contrast. COMPARISON:  None. FINDINGS: Lower chest: Lung bases clear.  Heart size within normal limits. Hepatobiliary: Taking into account limitation by non contrast imaging, no worrisome hepatic lesion. No calcified gallstone. Pancreas: Taking into account limitation by non contrast imaging, no  worrisome pancreatic lesion or inflammation. Spleen: Taking into account limitation by non contrast imaging, no splenic mass or enlargement. Adrenals/Urinary Tract: 3.5 mm distal left ureteral obstructing stone located 5.8 cm proximal to the left ureterovesical junction is causing moderate left hydroureteronephrosis with extravasation of urine around the left kidney and left ureter. There may be a second tiny stone just proximal to this level. Taking into account limitation by non contrast imaging, no worrisome renal or adrenal lesion. Noncontrast filled views of the urinary bladder unremarkable. Stomach/Bowel: No extraluminal bowel inflammatory process. Vascular/Lymphatic: No aortic aneurysm. Trace calcification right iliac bifurcation. No adenopathy. Reproductive: Coarse prostate gland calcifications. Other: No free intraperitoneal air or bowel containing hernia. Musculoskeletal: No worrisome osseous abnormality. IMPRESSION: 3.5 mm distal left ureteral obstructing stone located 5.8 cm proximal to the left  ureterovesical junction is causing moderate left hydroureteronephrosis with extravasation of urine around the left kidney and left ureter. There may be a second tiny stone just proximal to this level. Trace calcification right common iliac bifurcation/proximal right internal iliac artery. Electronically Signed   By: Lacy DuverneySteven  Olson M.D.   On: 12/20/2017 17:13   ____________________________________________    PROCEDURES  Procedure(s) performed: None  Procedures  Critical Care performed: None  ____________________________________________   INITIAL IMPRESSION / ASSESSMENT AND PLAN / ED COURSE  Pertinent labs & imaging results that were available during my care of the patient were reviewed by me and considered in my medical decision making (see chart for details).  Patient here with sudden onset flank pain most consistent with kidney stone given that he does not have a history of said, I did a CT scan I gave him Toradol he is pain-free after Toradol CT scan shows a kidney stone, I did discuss the kidney stone CT scan findings with Dr. Retta Dionesahlstedt of urology, who does not feel any further intervention is needed.  We also discussed patient's lab findings urinalysis etc.  He will follow-up as an outpatient we will treat him with the customary treatments.  Return precautions follow-up given understood patient is advised not to drive on Percocet.    ____________________________________________   FINAL CLINICAL IMPRESSION(S) / ED DIAGNOSES  Final diagnoses:  Kidney stone      This chart was dictated using voice recognition software.  Despite best efforts to proofread,  errors can occur which can change meaning.      Jeanmarie PlantMcShane, Josian Lanese A, MD 12/20/17 2010

## 2017-12-20 NOTE — ED Notes (Signed)
Pt states he is unable to void at this time.

## 2017-12-20 NOTE — ED Triage Notes (Signed)
Pt ambulatory to bathroom in triage.

## 2017-12-20 NOTE — Discharge Instructions (Signed)
Do not drive and Percocet, take Percocet only as needed Motrin will help your pain more.  If you have high fever or persistent vomiting or other significant comp occasions please return to the emergency department.  Follow closely with urology.

## 2017-12-20 NOTE — ED Triage Notes (Signed)
Sudden onset left lower quadrant pain and vomiting.  Pt  Is sweating and moaning.  Says it woke h im

## 2017-12-20 NOTE — ED Notes (Signed)
Pt resting quietly in recliner with eyes closed, even and nonlabored respirations noted.

## 2017-12-20 NOTE — ED Notes (Signed)
Pt in subwait, cursing at staff, screaming for "more fucking fentanyl", Charge RN made aware, patient relations to bedside.

## 2017-12-20 NOTE — ED Notes (Signed)
Patient transported to CT 

## 2019-12-09 IMAGING — CT CT RENAL STONE PROTOCOL
3 of 4 series · 10 of 46 positions shown, 17 images · non-contrast
Comparison: None.

CLINICAL DATA: 36-year-old male left-sided flank pain starting this
morning. Initial encounter.

EXAM:
CT ABDOMEN AND PELVIS WITHOUT CONTRAST
TECHNIQUE: Multidetector CT imaging of the abdomen and pelvis was performed
following the standard protocol without IV contrast.

[Series 4: lung bases · axial · 0.70mm/px · z∈[-201,-101]mm · 6 of 29 slices shown, 11 images]
[im 5/29  soft-tissue]
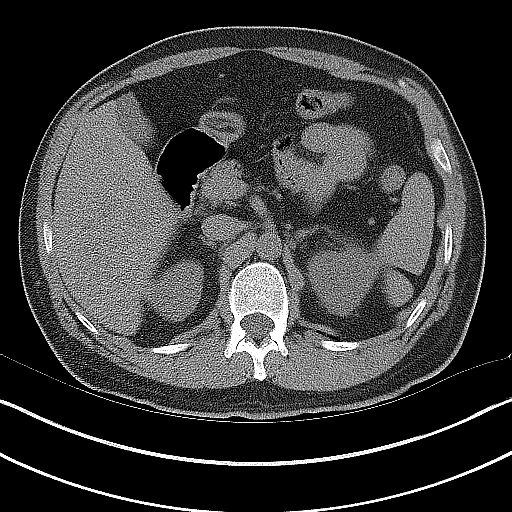
[im 5/29  bone]
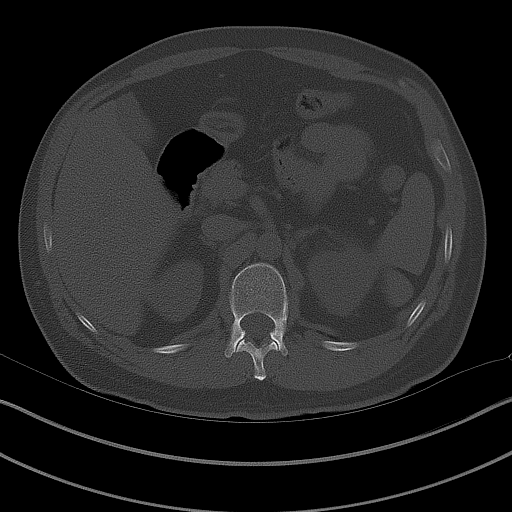
[im 9/29  soft-tissue]
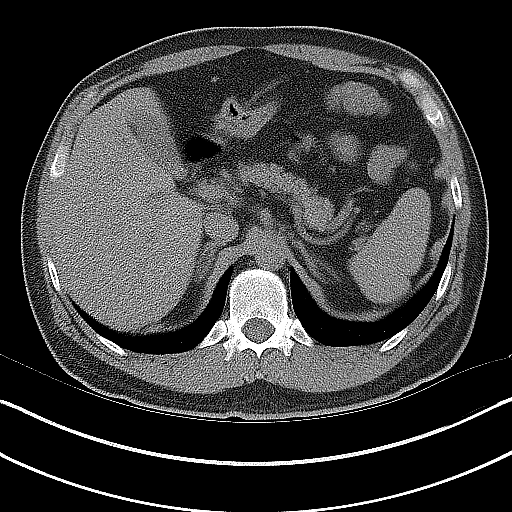
[im 13/29  soft-tissue]
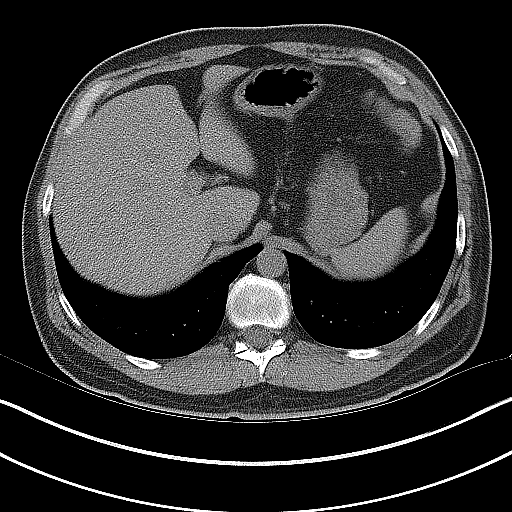
[im 13/29  lung]
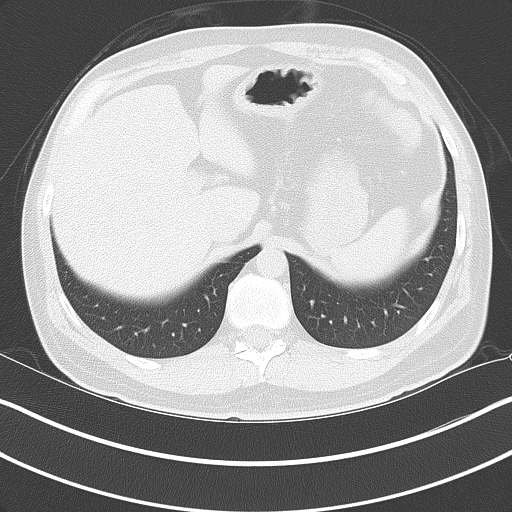
[im 17/29  soft-tissue]
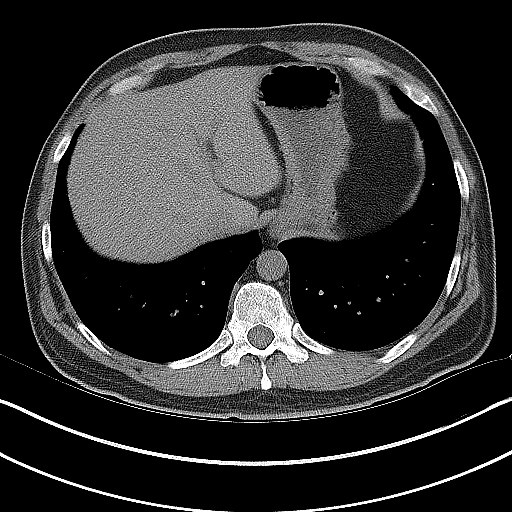
[im 17/29  lung]
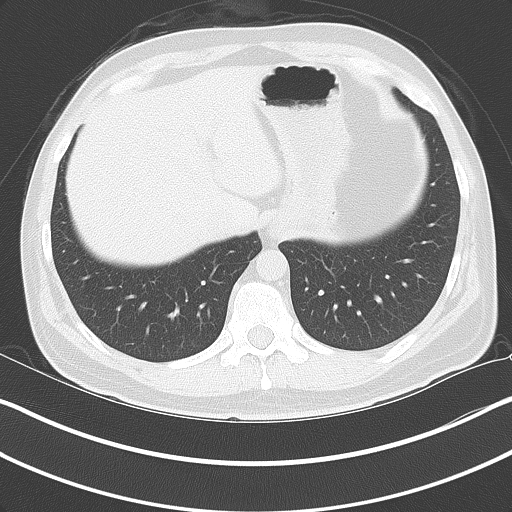
[im 21/29  soft-tissue]
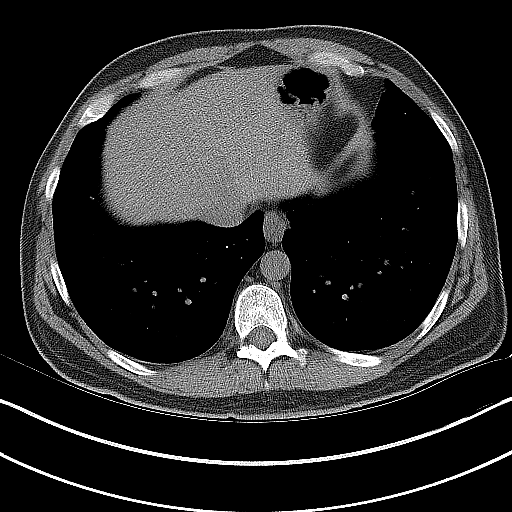
[im 21/29  lung]
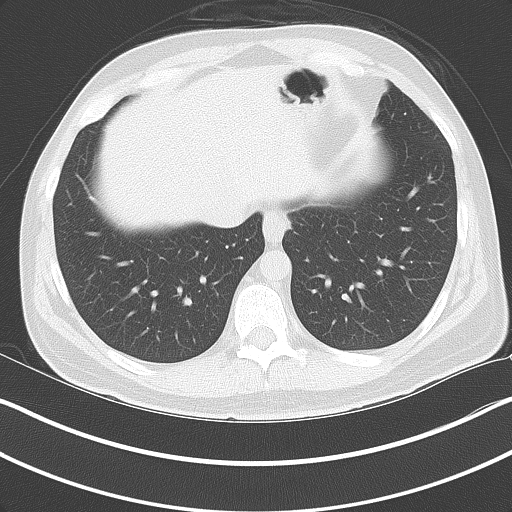
[im 25/29  soft-tissue]
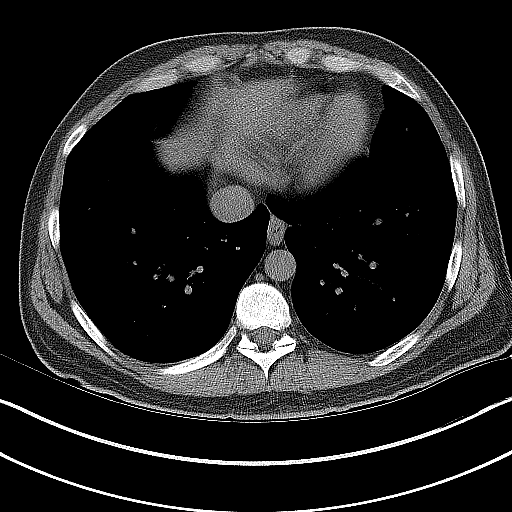
[im 25/29  lung]
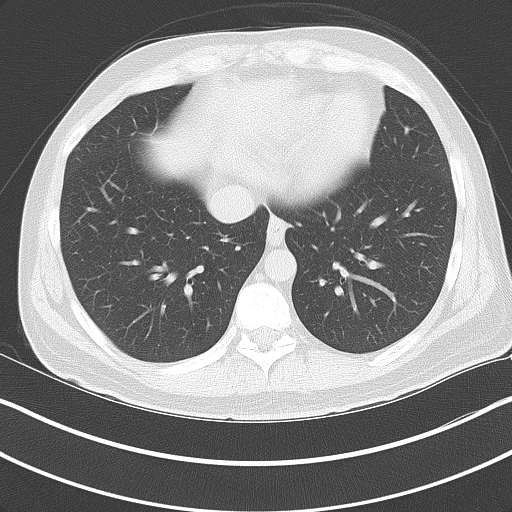

[Series 5: coronal · coronal · 0.73mm/px · 3 of 141 slices shown, 4 images]
[im 47/141  soft-tissue]
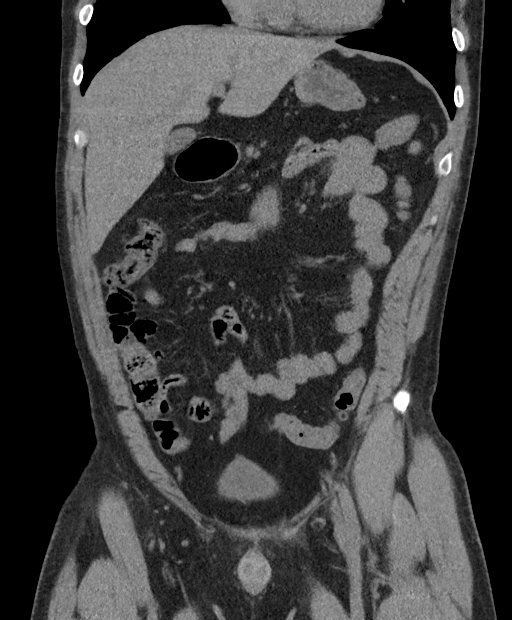
[im 63/141  soft-tissue]
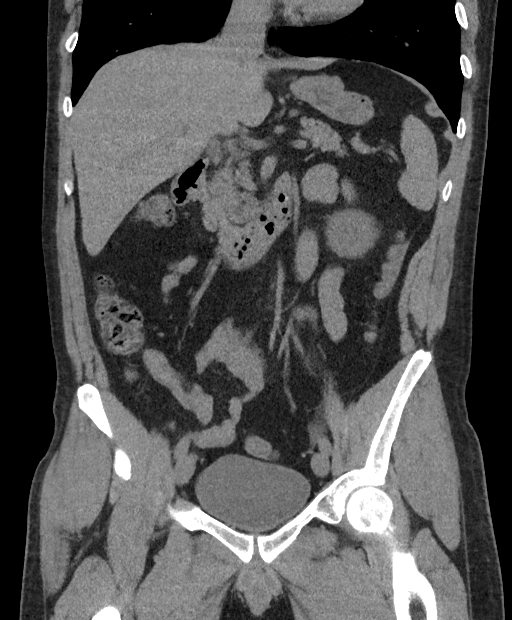
[im 63/141  bone]
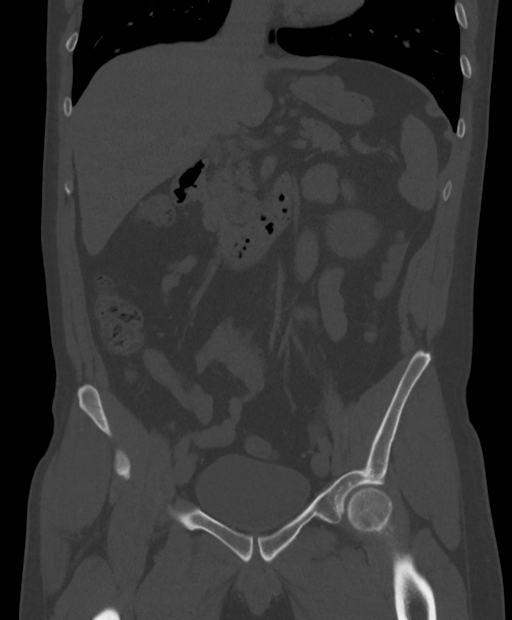
[im 78/141  soft-tissue]
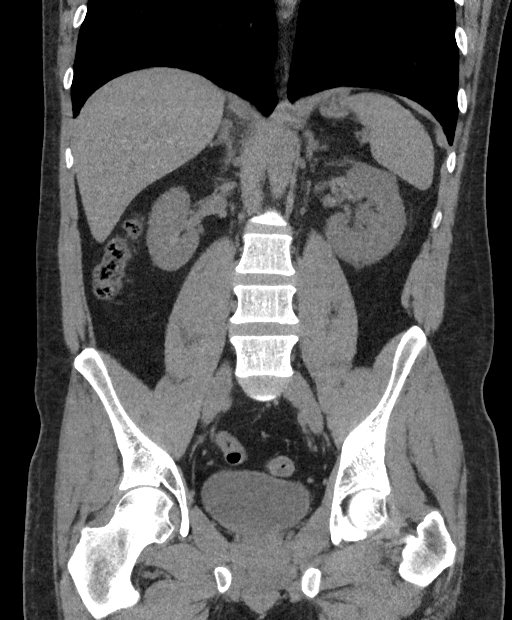

[Series 6: sagittal · sagittal · 0.56mm/px · 1 of 161 slices shown, 2 images]
[im 54/161  soft-tissue]
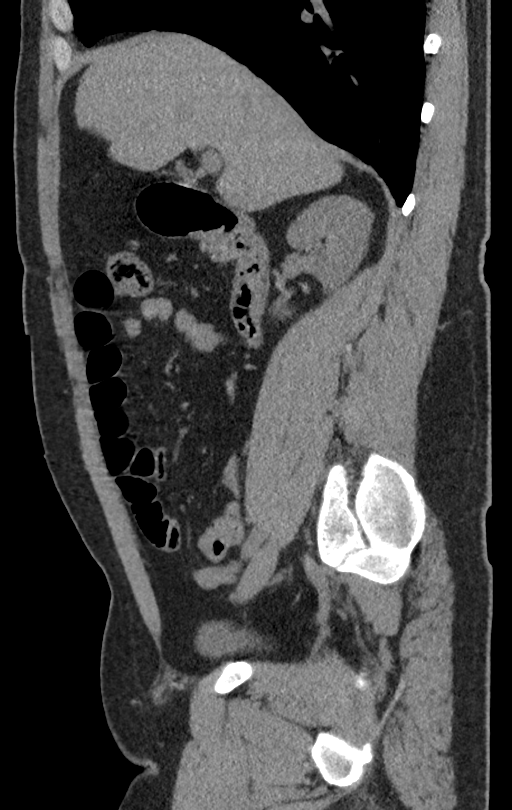
[im 54/161  bone]
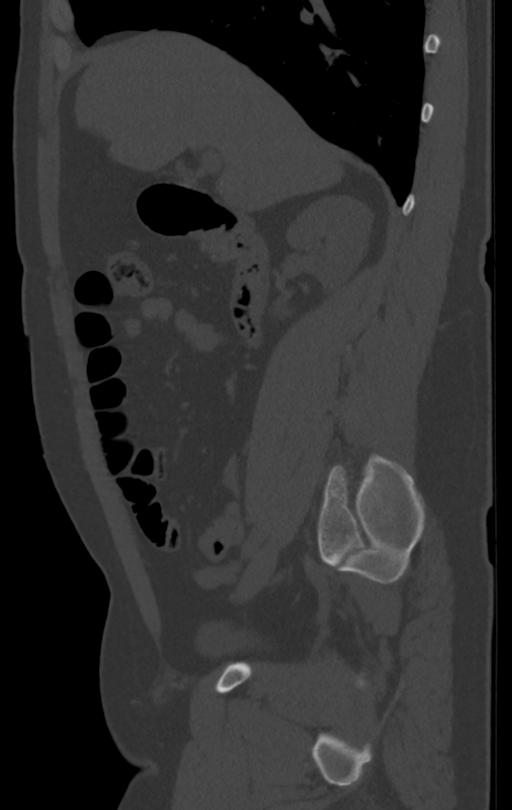

[10 of 46 positions shown; findings below may reference images not displayed]

FINDINGS: Lower chest: Lung bases clear.  Heart size within normal limits.

Hepatobiliary: Taking into account limitation by non contrast
imaging, no worrisome hepatic lesion. No calcified gallstone.

Pancreas: Taking into account limitation by non contrast imaging, no
worrisome pancreatic lesion or inflammation.

Spleen: Taking into account limitation by non contrast imaging, no
splenic mass or enlargement.

Adrenals/Urinary Tract: 3.5 mm distal left ureteral obstructing
stone located 5.8 cm proximal to the left ureterovesical junction is
causing moderate left hydroureteronephrosis with extravasation of
urine around the left kidney and left ureter. There may be a second
tiny stone just proximal to this level.

Taking into account limitation by non contrast imaging, no worrisome
renal or adrenal lesion. Noncontrast filled views of the urinary
bladder unremarkable.

Stomach/Bowel: No extraluminal bowel inflammatory process.

Vascular/Lymphatic: No aortic aneurysm. Trace calcification right
iliac bifurcation.

No adenopathy.

Reproductive: Coarse prostate gland calcifications.

Other: No free intraperitoneal air or bowel containing hernia.

Musculoskeletal: No worrisome osseous abnormality.
IMPRESSION: 3.5 mm distal left ureteral obstructing stone located 5.8 cm
proximal to the left ureterovesical junction is causing moderate
left hydroureteronephrosis with extravasation of urine around the
left kidney and left ureter. There may be a second tiny stone just
proximal to this level.

Trace calcification right common iliac bifurcation/proximal right
internal iliac artery.

## 2021-11-02 ENCOUNTER — Emergency Department (HOSPITAL_COMMUNITY)
Admission: EM | Admit: 2021-11-02 | Discharge: 2021-11-02 | Disposition: A | Discharge status: Home / Self Care | Payer: Self-pay | Attending: Emergency Medicine | Admitting: Emergency Medicine

## 2021-11-02 ENCOUNTER — Emergency Department (HOSPITAL_COMMUNITY): Payer: Self-pay

## 2021-11-02 ENCOUNTER — Other Ambulatory Visit: Payer: Self-pay

## 2021-11-02 ENCOUNTER — Encounter (HOSPITAL_COMMUNITY): Payer: Self-pay

## 2021-11-02 DIAGNOSIS — Z23 Encounter for immunization: Secondary | ICD-10-CM | POA: Insufficient documentation

## 2021-11-02 DIAGNOSIS — S61012A Laceration without foreign body of left thumb without damage to nail, initial encounter: Secondary | ICD-10-CM | POA: Insufficient documentation

## 2021-11-02 DIAGNOSIS — W260XXA Contact with knife, initial encounter: Secondary | ICD-10-CM | POA: Insufficient documentation

## 2021-11-02 MED ORDER — TETANUS-DIPHTH-ACELL PERTUSSIS 5-2.5-18.5 LF-MCG/0.5 IM SUSY
0.5000 mL | PREFILLED_SYRINGE | Freq: Once | INTRAMUSCULAR | Status: AC
  Administered 2021-11-02: 0.5 mL via INTRAMUSCULAR
  Filled 2021-11-02: qty 0.5

## 2021-11-02 NOTE — ED Provider Notes (Signed)
?[facility] ?Provider Note ? ? ?CSN: [id number] ?Arrival date & time: 11/02/21  2549 ? ?  ? ?History ? ?Chief Complaint  ?Patient presents with  ? Laceration  ? ? ?Carl Holt is a 41 y.o. male with a past medical history significant for bipolar disorder and depression who presents to the ED due to laceration to left thumb.  Patient notes he cut his left thumb with a knife on Friday, 2 days ago.  Unsure when his last tetanus shot was.  Patient admits to pain with movement of thumb.  No fever or chills.  No purulent drainage.  Patient has been using bulky dressings.  Patient is right-hand dominant. ? ? ? ?  ? ?Home Medications ?Prior to Admission medications   ?Medication Sig Start Date End Date Taking? Authorizing Provider  ?CarBAMazepine (TEGRETOL PO) Take 1 tablet by mouth daily.    [provider]  ?diphenhydrAMINE (BENADRYL) 25 mg capsule Take 1 capsule (25 mg total) by mouth every 4 (four) hours as needed. 05/31/16 05/31/17  Jene Every, MD  ?ibuprofen (MOTRIN IB) 200 MG tablet Take 3 tablets (600 mg total) by mouth every 6 (six) hours as needed. 12/20/17   Jeanmarie Plant, MD  ?naproxen (NAPROSYN) 500 MG tablet Take 1 tablet (500 mg total) by mouth 2 (two) times daily with a meal. 11/05/16   Joni Reining, PA-C  ?ondansetron (ZOFRAN) 4 MG tablet Take 1 tablet (4 mg total) by mouth every 8 (eight) hours as needed for nausea or vomiting. 12/20/17   Jeanmarie Plant, MD  ?oxyCODONE-acetaminophen (PERCOCET) 5-325 MG tablet Take 1 tablet by mouth every 4 (four) hours as needed for severe pain. 12/20/17   Jeanmarie Plant, MD  ?predniSONE (DELTASONE) 50 MG tablet Take 1 tablet (50 mg total) by mouth daily with breakfast. 05/31/16   Jene Every, MD  ?tamsulosin (FLOMAX) 0.4 MG CAPS capsule Take 1 capsule (0.4 mg total) by mouth daily. 12/20/17   Jeanmarie Plant, MD  ?   ? ?Allergies    ?Bee venom   ? ?Review of Systems   ?Review of Systems  ?Skin:  Positive for wound.  ?Neurological:   Negative for numbness.  ? ?Physical Exam ?Updated Vital Signs ?BP (!) 155/111 (BP Location: Right Arm)   Pulse 74   Temp 98 ?F (36.7 ?C) (Oral)   Resp 18   Ht 5\' 9"  (1.753 m)   Wt 81.6 kg   SpO2 95%   BMI 26.58 kg/m?  ?Physical Exam ?Vitals and nursing note reviewed.  ?Constitutional:   ?   General: He is not in acute distress. ?   Appearance: He is not ill-appearing.  ?HENT:  ?   Head: Normocephalic.  ?Eyes:  ?   Pupils: Pupils are equal, round, and reactive to light.  ?Cardiovascular:  ?   Rate and Rhythm: Normal rate and regular rhythm.  ?   Pulses: Normal pulses.  ?   Heart sounds: Normal heart sounds. No murmur heard. ?  No friction rub. No gallop.  ?Pulmonary:  ?   Effort: Pulmonary effort is normal.  ?   Breath sounds: Normal breath sounds.  ?Abdominal:  ?   General: Abdomen is flat. There is no distension.  ?   Palpations: Abdomen is soft.  ?   Tenderness: There is no abdominal tenderness. There is no guarding or rebound.  ?Musculoskeletal:     ?   General: Normal range of motion.  ?   Cervical  back: Neck supple.  ?   Comments: Tenderness surrounding laceration to left thumb. Decreased ROM due to pain. No edema, erythema or warmth of left thumb. Radial pulse intact.   ?Skin: ?   General: Skin is warm and dry.  ?   Comments: 2cm laceration to left thumb. No purulent drainage  ?Neurological:  ?   General: No focal deficit present.  ?   Mental Status: He is alert.  ?Psychiatric:     ?   Mood and Affect: Mood normal.     ?   Behavior: Behavior normal.  ? ? ?ED Results / Procedures / Treatments   ?Labs ?(all labs ordered are listed, but only abnormal results are displayed) ?Labs Reviewed - No data to display ? ?EKG ?None ? ?Radiology ?DG Finger Thumb Left ? ?Result Date: 11/02/2021 ?CLINICAL DATA:  Left thumb laceration 2 days ago. EXAM: LEFT THUMB 2+V COMPARISON:  None. FINDINGS: No fracture or bone lesion. Joints are normally spaced and aligned.  No arthropathic changes. Soft tissues are unremarkable.  No  radiopaque foreign body. IMPRESSION: 1. No fracture, joint abnormality or radiopaque foreign body. Electronically Signed   By: Amie Portland M.D.   On: 11/02/2021 10:07   ? ?Procedures ?Procedures  ? ? ?Medications Ordered in ED ?Medications  ?Tdap (BOOSTRIX) injection 0.5 mL (has no administration in time range)  ? ? ?ED Course/ Medical Decision Making/ A&P ?  ?                        ?Medical Decision Making ?Amount and/or Complexity of Data Reviewed ?Radiology: ordered and independent interpretation performed. Decision-making details documented in ED Course. ? ?Risk ?Prescription drug management. ? ? ?41 year old male presents to the ED due to laceration to left thumb that occurred on Friday, 2 days ago.  Upon arrival, stable vitals.  Patient in no acute distress.  Physical exam significant for 2 cm laceration to left thumb with some bleeding after manipulation of laceration. No nailbed involvement. Low suspicion for ligament involvement. No surrounding erythema or edema.  No signs of infection.  Patient is right-hand dominant.  Unsure when his last tetanus shot was.  Tetanus updated here in the ED.  X-ray obtained to rule out bony fractures which I personally reviewed and interpreted which is negative for any acute abnormalities. No bony fractures.  Shared decision making in regards to laceration repair versus allow secondary closure. Patient prefers to allow secondary closure given increased risk of infection since it occurred 2 days ago. Wound thoroughly cleaned here in the ED. Bulky dressing placed. Hand surgeon number given to patient and advised to call to schedule an appointment if symptoms do not improve over the next week. Strict ED precautions discussed with patient. Patient states understanding and agrees to plan. Patient discharged home in no acute distress and stable vitals ? ? ? ? ? ? ? ?Final Clinical Impression(s) / ED Diagnoses ?Final diagnoses:  ?Laceration of left thumb without foreign body  without damage to nail, initial encounter  ? ? ?Rx / DC Orders ?ED Discharge Orders   ? ? None  ? ?  ? ? ?  ?Mannie Stabile, PA-C ?11/02/21 1016 ? ?  ?Eber Hong, MD ?11/03/21 2028 ? ?

## 2021-11-02 NOTE — ED Triage Notes (Signed)
Pt presents to ED with complaints of laceration to left thumb, happened Friday ?

## 2021-11-02 NOTE — Discharge Instructions (Addendum)
It was a pleasure taking care of you today. As discussed, your x-ray did not show any broken bones. Given your risk of infection is greater since it has been 2 days, we collectively decided to keep laceration open. Continue to keep area clean. Follow-up with PCP within 1 week if symptoms do not improve. You may take over the counter ibuprofen or tylenol as needed for pain. Return to the ER for new or worsening symptoms.  ? ?I have included the number of the hand surgeon. Call to schedule an appointment if symptoms do not improve over the next week. ?

## 2022-06-25 DIAGNOSIS — M9902 Segmental and somatic dysfunction of thoracic region: Secondary | ICD-10-CM | POA: Diagnosis not present

## 2022-06-25 DIAGNOSIS — M9901 Segmental and somatic dysfunction of cervical region: Secondary | ICD-10-CM | POA: Diagnosis not present

## 2022-06-25 DIAGNOSIS — M5414 Radiculopathy, thoracic region: Secondary | ICD-10-CM | POA: Diagnosis not present

## 2022-06-25 DIAGNOSIS — M9903 Segmental and somatic dysfunction of lumbar region: Secondary | ICD-10-CM | POA: Diagnosis not present

## 2022-06-30 DIAGNOSIS — M9902 Segmental and somatic dysfunction of thoracic region: Secondary | ICD-10-CM | POA: Diagnosis not present

## 2022-06-30 DIAGNOSIS — M5414 Radiculopathy, thoracic region: Secondary | ICD-10-CM | POA: Diagnosis not present

## 2022-06-30 DIAGNOSIS — M9901 Segmental and somatic dysfunction of cervical region: Secondary | ICD-10-CM | POA: Diagnosis not present

## 2022-06-30 DIAGNOSIS — M9903 Segmental and somatic dysfunction of lumbar region: Secondary | ICD-10-CM | POA: Diagnosis not present

## 2022-07-02 DIAGNOSIS — M9901 Segmental and somatic dysfunction of cervical region: Secondary | ICD-10-CM | POA: Diagnosis not present

## 2022-07-02 DIAGNOSIS — M9902 Segmental and somatic dysfunction of thoracic region: Secondary | ICD-10-CM | POA: Diagnosis not present

## 2022-07-02 DIAGNOSIS — M9903 Segmental and somatic dysfunction of lumbar region: Secondary | ICD-10-CM | POA: Diagnosis not present

## 2022-07-02 DIAGNOSIS — M5414 Radiculopathy, thoracic region: Secondary | ICD-10-CM | POA: Diagnosis not present

## 2022-07-09 DIAGNOSIS — M9903 Segmental and somatic dysfunction of lumbar region: Secondary | ICD-10-CM | POA: Diagnosis not present

## 2022-07-09 DIAGNOSIS — M9902 Segmental and somatic dysfunction of thoracic region: Secondary | ICD-10-CM | POA: Diagnosis not present

## 2022-07-09 DIAGNOSIS — M9901 Segmental and somatic dysfunction of cervical region: Secondary | ICD-10-CM | POA: Diagnosis not present

## 2022-07-09 DIAGNOSIS — M5414 Radiculopathy, thoracic region: Secondary | ICD-10-CM | POA: Diagnosis not present

## 2022-07-14 DIAGNOSIS — M9901 Segmental and somatic dysfunction of cervical region: Secondary | ICD-10-CM | POA: Diagnosis not present

## 2022-07-14 DIAGNOSIS — M9903 Segmental and somatic dysfunction of lumbar region: Secondary | ICD-10-CM | POA: Diagnosis not present

## 2022-07-14 DIAGNOSIS — M9902 Segmental and somatic dysfunction of thoracic region: Secondary | ICD-10-CM | POA: Diagnosis not present

## 2022-07-14 DIAGNOSIS — M5414 Radiculopathy, thoracic region: Secondary | ICD-10-CM | POA: Diagnosis not present

## 2023-10-22 IMAGING — DX DG FINGER THUMB 2+V*L*
3 series · 3 of 3 positions shown · non-contrast
Comparison: None.

CLINICAL DATA: Left thumb laceration 2 days ago.

EXAM:
LEFT THUMB 2+V

[finger obl]
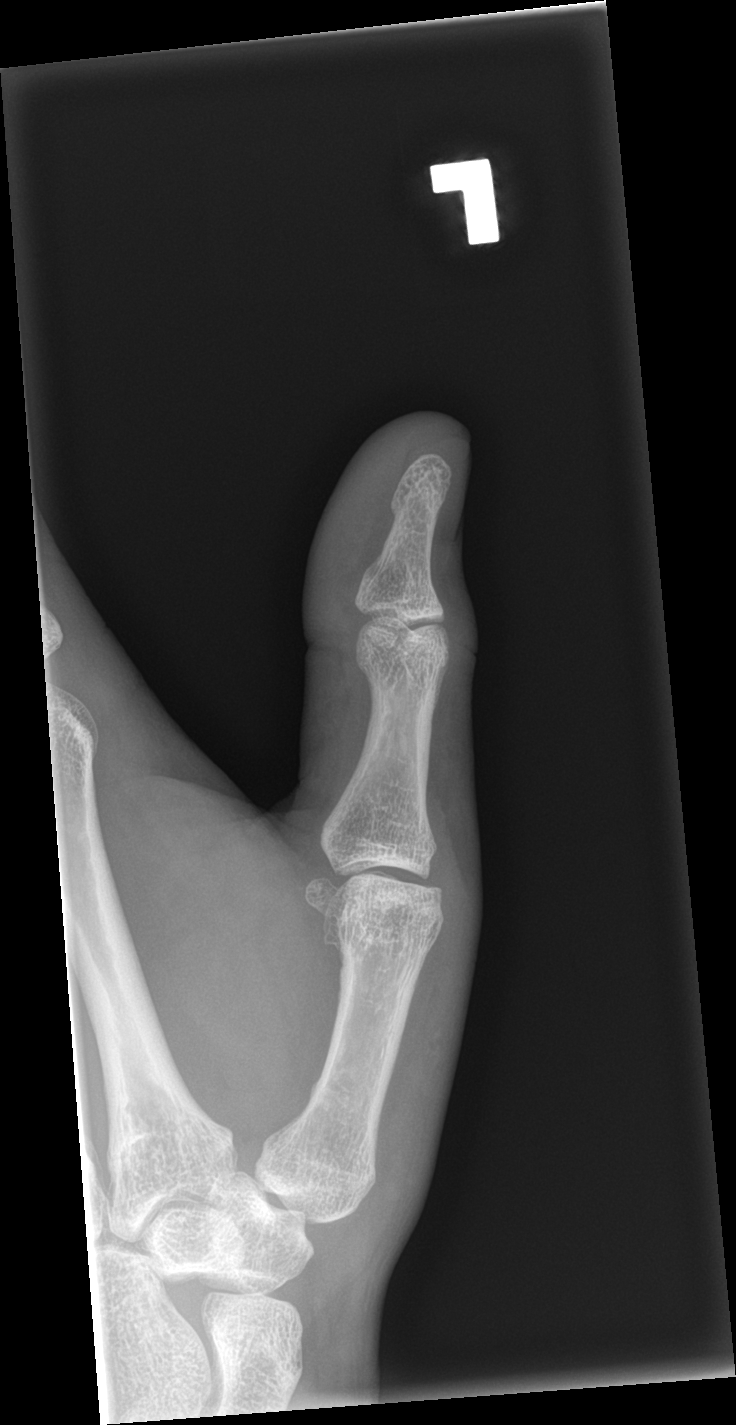

[finger ap]
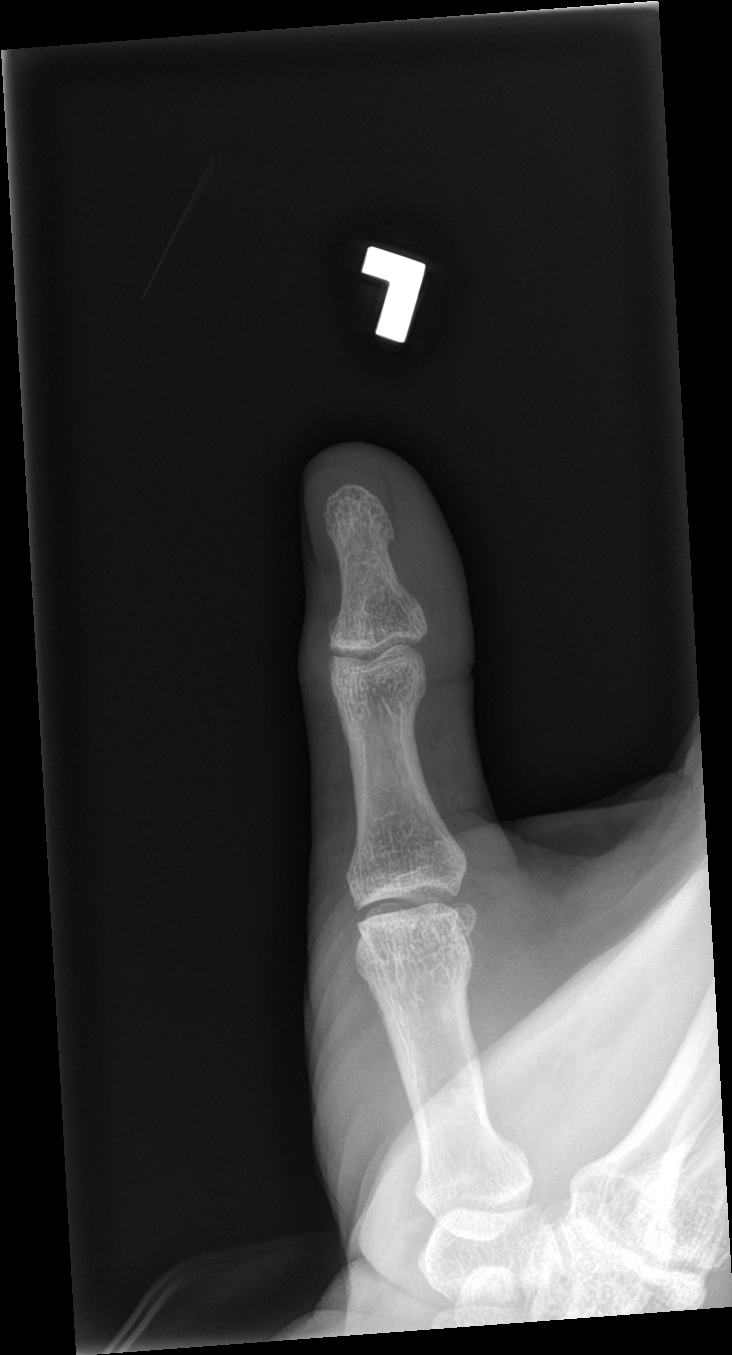

[finger lat]
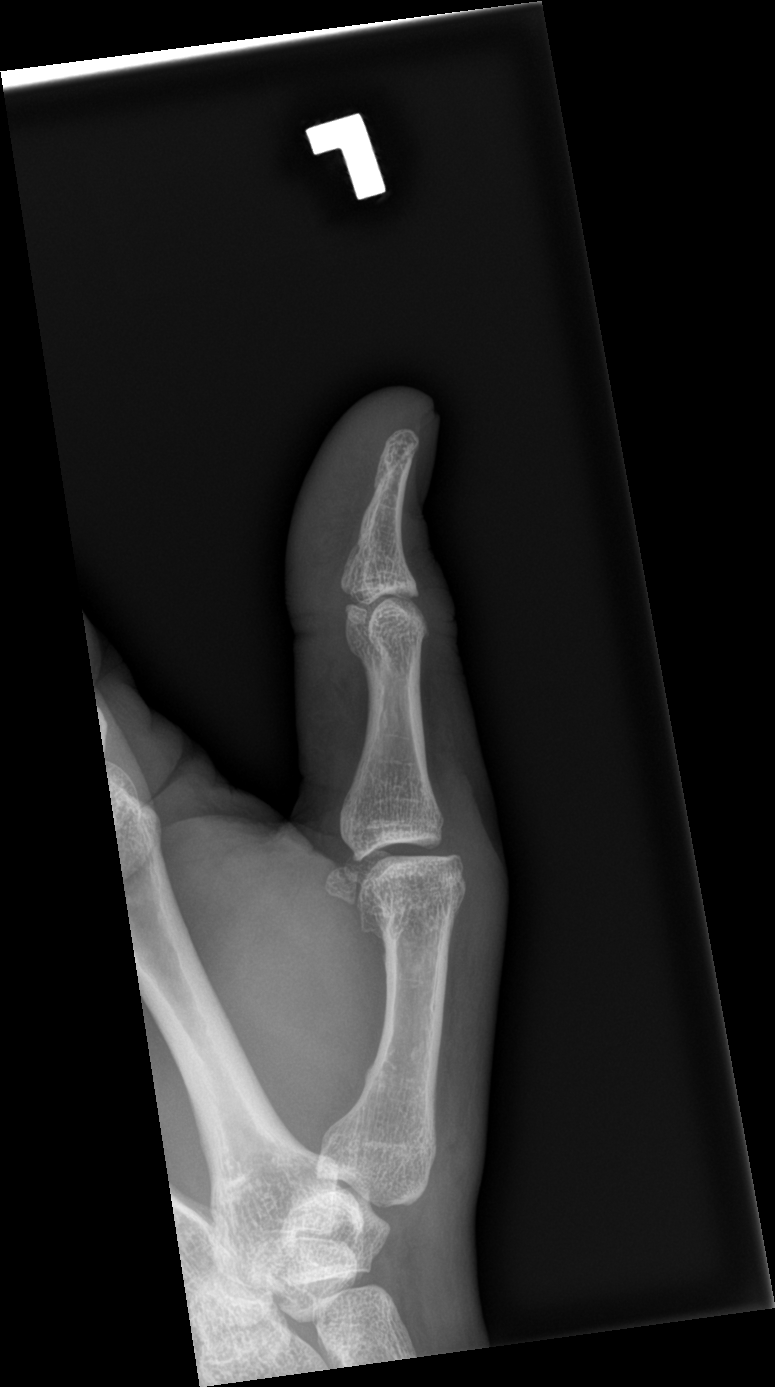

[3 of 3 positions shown; findings below may reference images not displayed]

FINDINGS: No fracture or bone lesion.

Joints are normally spaced and aligned.  No arthropathic changes.

Soft tissues are unremarkable.  No radiopaque foreign body.
IMPRESSION: 1. No fracture, joint abnormality or radiopaque foreign body.

## 2023-10-25 ENCOUNTER — Other Ambulatory Visit: Payer: Self-pay

## 2023-10-25 ENCOUNTER — Emergency Department
Admission: EM | Admit: 2023-10-25 | Discharge: 2023-10-25 | Disposition: A | Discharge status: Home / Self Care | Payer: Medicaid Other | Attending: Emergency Medicine | Admitting: Emergency Medicine

## 2023-10-25 ENCOUNTER — Emergency Department: Payer: Medicaid Other

## 2023-10-25 DIAGNOSIS — I10 Essential (primary) hypertension: Secondary | ICD-10-CM | POA: Diagnosis not present

## 2023-10-25 DIAGNOSIS — R0789 Other chest pain: Secondary | ICD-10-CM | POA: Diagnosis present

## 2023-10-25 LAB — CBC
HCT: 45.2 % (ref 39.0–52.0)
Hemoglobin: 16.4 g/dL (ref 13.0–17.0)
MCH: 31.1 pg (ref 26.0–34.0)
MCHC: 36.3 g/dL — ABNORMAL HIGH (ref 30.0–36.0)
MCV: 85.8 fL (ref 80.0–100.0)
Platelets: 270 10*3/uL (ref 150–400)
RBC: 5.27 MIL/uL (ref 4.22–5.81)
RDW: 11.9 % (ref 11.5–15.5)
WBC: 10 10*3/uL (ref 4.0–10.5)
nRBC: 0 % (ref 0.0–0.2)

## 2023-10-25 LAB — BASIC METABOLIC PANEL
Anion gap: 11 (ref 5–15)
BUN: 17 mg/dL (ref 6–20)
CO2: 24 mmol/L (ref 22–32)
Calcium: 8.8 mg/dL — ABNORMAL LOW (ref 8.9–10.3)
Chloride: 104 mmol/L (ref 98–111)
Creatinine, Ser: 1.02 mg/dL (ref 0.61–1.24)
GFR, Estimated: >60 mL/min (ref >60)
Glucose, Bld: 116 mg/dL — ABNORMAL HIGH (ref 70–99)
Potassium: 3.7 mmol/L (ref 3.5–5.1)
Sodium: 139 mmol/L (ref 135–145)

## 2023-10-25 LAB — TROPONIN I (HIGH SENSITIVITY)
Troponin I (High Sensitivity): 5 ng/L (ref <18)
Troponin I (High Sensitivity): 6 ng/L (ref <18)

## 2023-10-25 MED ORDER — AMLODIPINE BESYLATE 5 MG PO TABS
5.0000 mg | ORAL_TABLET | Freq: Once | ORAL | Status: AC
  Administered 2023-10-25: 5 mg via ORAL
  Filled 2023-10-25: qty 1

## 2023-10-25 MED ORDER — AMLODIPINE BESYLATE 5 MG PO TABS: 5.0000 mg | ORAL_TABLET | Freq: Every day | ORAL | 0 refills | Status: AC

## 2023-10-25 NOTE — Discharge Instructions (Addendum)
 Take the amlodipine daily as prescribed.  You should check your blood pressure every few days and keep a logbook for when you follow-up.  Follow-up with the [facility] next month as scheduled.  Return to the ER for new, worsening, or persistent severe chest pain, very high blood pressure readings especially over 200 on the top number or 120 on the bottom number, or any other new or worsening symptoms that concern you.

## 2023-10-25 NOTE — ED Triage Notes (Addendum)
 Patient states he's been donating plasma over the past couple of weeks; states at all 5 visits he has had high blood pressure readings; also reports headache and chest tightness.

## 2023-10-25 NOTE — ED Provider Notes (Signed)
 [facility] Provider Note    Event Date/Time   First MD Initiated Contact with Patient 10/25/23 1831     (approximate)   History   Hypertension   HPI  Carl Holt is a 43 y.o. male with no significant past medical history who presents with elevated blood pressure readings over the last several weeks and with some chest pain since earlier today.  The pain is described as tightness or pressure.  Is not exertional.  He states it is now resolved.  He denies any shortness of breath or lightheadedness.  He has no headache.  He states that he frequently donates plasma and has been rejected several times over the last few weeks due to elevated blood pressure readings, often with a systolic reading in the 160s to 180s, and diastolic reading as high as 120.  I reviewed the past medical records.  The patient's most recent documented counter was in the ED at [facility] in March 2023 for a laceration.  Physical Exam   Triage Vital Signs: ED Triage Vitals  Encounter Vitals Group     BP 10/25/23 1656 (!) 195/131     Systolic BP Percentile --      Diastolic BP Percentile --      Pulse Rate 10/25/23 1656 (!) 114     Resp 10/25/23 1656 20     Temp 10/25/23 1656 (!) 97.5 F (36.4 C)     Temp Source 10/25/23 1656 Oral     SpO2 10/25/23 1656 98 %     Weight 10/25/23 1658 184 lb (83.5 kg)     Height 10/25/23 1658 5\' 9"  (1.753 m)     Head Circumference --      Peak Flow --      Pain Score 10/25/23 1656 3     Pain Loc --      Pain Education --      Exclude from Growth Chart --     Most recent vital signs: Vitals:   10/25/23 1845 10/25/23 1900  BP:  (!) 148/109  Pulse:  76  Resp:  17  Temp:    SpO2: 98% 99%     General: Alert, well-appearing, no distress.  CV:  Good peripheral perfusion.  Resp:  Normal effort.  Abd:  No distention.  Other:  No peripheral edema.   ED Results / Procedures / Treatments   Labs (all labs ordered are listed, but only  abnormal results are displayed) Labs Reviewed  BASIC METABOLIC PANEL - Abnormal; Notable for the following components:      Result Value   Glucose, Bld 116 (*)    Calcium 8.8 (*)    All other components within normal limits  CBC - Abnormal; Notable for the following components:   MCHC 36.3 (*)    All other components within normal limits  TROPONIN I (HIGH SENSITIVITY)  TROPONIN I (HIGH SENSITIVITY)     EKG  ED ECG REPORT I, Dionne Bucy, the attending physician, personally viewed and interpreted this ECG.  Date: 10/25/2023 EKG Time: 1702 Rate: 109 Rhythm: Sinus tachycardia QRS Axis: normal Intervals: normal ST/T Wave abnormalities: normal Narrative Interpretation: no evidence of acute ischemia    RADIOLOGY  Chest x-ray: I independently viewed and interpreted the images; there is no focal consolidation or edema  PROCEDURES:  Critical Care performed: No  Procedures   MEDICATIONS ORDERED IN ED: Medications  amLODipine (NORVASC) tablet 5 mg (5 mg Oral Given 10/25/23 1919)  IMPRESSION / MDM / ASSESSMENT AND PLAN / ED COURSE  I reviewed the triage vital signs and the nursing notes.  43 year old male with PMH as noted above presents with elevated blood pressure readings over the last several weeks as well as some atypical chest pain earlier today which has now resolved.  On exam the patient is hypertensive with otherwise normal vital signs.  Physical exam is otherwise unremarkable for acute findings.  Differential diagnosis includes, but is not limited to, essential hypertension.  I have a low suspicion for ACS given the nature of the pain that the patient describes.  Since it has resolved, there is no clinical evidence for PE, aortic dissection, or other vascular etiology.  BMP shows a normal creatinine.  CBC is unremarkable.  Initial troponin is negative.  We will obtain a repeat troponin.  Given that the patient has had multiple elevated readings, we will  start him on amlodipine for hypertension.  Patient's presentation is most consistent with acute complicated illness / injury requiring diagnostic workup.  The patient is on the cardiac monitor to evaluate for evidence of arrhythmia and/or significant heart rate changes.  ----------------------------------------- 7:38 PM on 10/25/2023 -----------------------------------------  Repeat troponin is negative.  The patient remains asymptomatic at this time.  Blood pressure has improved in the ED.  The patient already has follow-up arranged with primary care at the [facility] clinic for 3/6.  I have prescribed a 1 month supply of amlodipine to start treatment for hypertension and instructed the patient on keeping the log of his blood pressure to provide data when he follows up.  I gave strict return precautions and he expresses understanding.   FINAL CLINICAL IMPRESSION(S) / ED DIAGNOSES   Final diagnoses:  Uncontrolled hypertension     Rx / DC Orders   ED Discharge Orders          Ordered    amLODipine (NORVASC) 5 MG tablet  Daily        10/25/23 1930             Note:  This document was prepared using Dragon voice recognition software and may include unintentional dictation errors.    Dionne Bucy, MD 10/25/23 [phone]

## 2023-12-02 ENCOUNTER — Ambulatory Visit: Payer: Medicaid Other | Admitting: Family Medicine
